# Patient Record
Sex: Female | Born: 1970 | Race: White | Hispanic: No | Marital: Married | Smoking: Never smoker
Health system: Southern US, Community
[De-identification: ages and names within clinical notes are randomized; demographics above are authoritative.]

## PROBLEM LIST (undated history)

## (undated) DIAGNOSIS — Z87442 Personal history of urinary calculi: Secondary | ICD-10-CM

## (undated) DIAGNOSIS — R519 Headache, unspecified: Secondary | ICD-10-CM

## (undated) DIAGNOSIS — IMO0002 Reserved for concepts with insufficient information to code with codable children: Secondary | ICD-10-CM

## (undated) DIAGNOSIS — R112 Nausea with vomiting, unspecified: Secondary | ICD-10-CM

## (undated) DIAGNOSIS — K219 Gastro-esophageal reflux disease without esophagitis: Secondary | ICD-10-CM

## (undated) DIAGNOSIS — M329 Systemic lupus erythematosus, unspecified: Secondary | ICD-10-CM

## (undated) DIAGNOSIS — I499 Cardiac arrhythmia, unspecified: Secondary | ICD-10-CM

## (undated) DIAGNOSIS — I10 Essential (primary) hypertension: Secondary | ICD-10-CM

## (undated) DIAGNOSIS — F419 Anxiety disorder, unspecified: Secondary | ICD-10-CM

## (undated) DIAGNOSIS — Z9889 Other specified postprocedural states: Secondary | ICD-10-CM

## (undated) DIAGNOSIS — R011 Cardiac murmur, unspecified: Secondary | ICD-10-CM

## (undated) HISTORY — PX: OTHER SURGICAL HISTORY: SHX169

## (undated) HISTORY — PX: HERNIA REPAIR: SHX51

---

## 2005-11-30 ENCOUNTER — Observation Stay: Payer: Self-pay

## 2005-12-22 ENCOUNTER — Inpatient Hospital Stay: Payer: Self-pay

## 2005-12-22 ENCOUNTER — Other Ambulatory Visit: Payer: Self-pay

## 2008-07-01 ENCOUNTER — Ambulatory Visit: Payer: Self-pay | Admitting: Unknown Physician Specialty

## 2010-11-18 ENCOUNTER — Ambulatory Visit: Payer: Self-pay | Admitting: Surgery

## 2012-02-15 ENCOUNTER — Ambulatory Visit: Payer: Self-pay | Admitting: Obstetrics and Gynecology

## 2012-02-21 ENCOUNTER — Ambulatory Visit: Payer: Self-pay | Admitting: Obstetrics and Gynecology

## 2013-01-24 ENCOUNTER — Other Ambulatory Visit: Payer: Self-pay | Admitting: Rheumatology

## 2013-01-29 LAB — CULTURE, BLOOD (SINGLE)

## 2013-07-03 ENCOUNTER — Ambulatory Visit: Payer: Self-pay | Admitting: Obstetrics and Gynecology

## 2013-08-17 IMAGING — MG MM ADDITIONAL VIEWS AT NO CHARGE
1 series · 2 of 2 positions shown · non-contrast
Comparison: none

REASON FOR EXAM: av lt spiculated asymmetry
COMMENTS:

PROCEDURE:     MAM - MAM DGTL ADD VW LT  SCR  - February 21, 2012  [DATE]
RESULT:     TECHNIQUE: Digital diagnostic left mammograms were obtained. FDA
approved computer-aided detection (CAD) for mammography was utilized for
this study.

[L MLO · left · 2 of 2 slices shown]
[im 1/2]
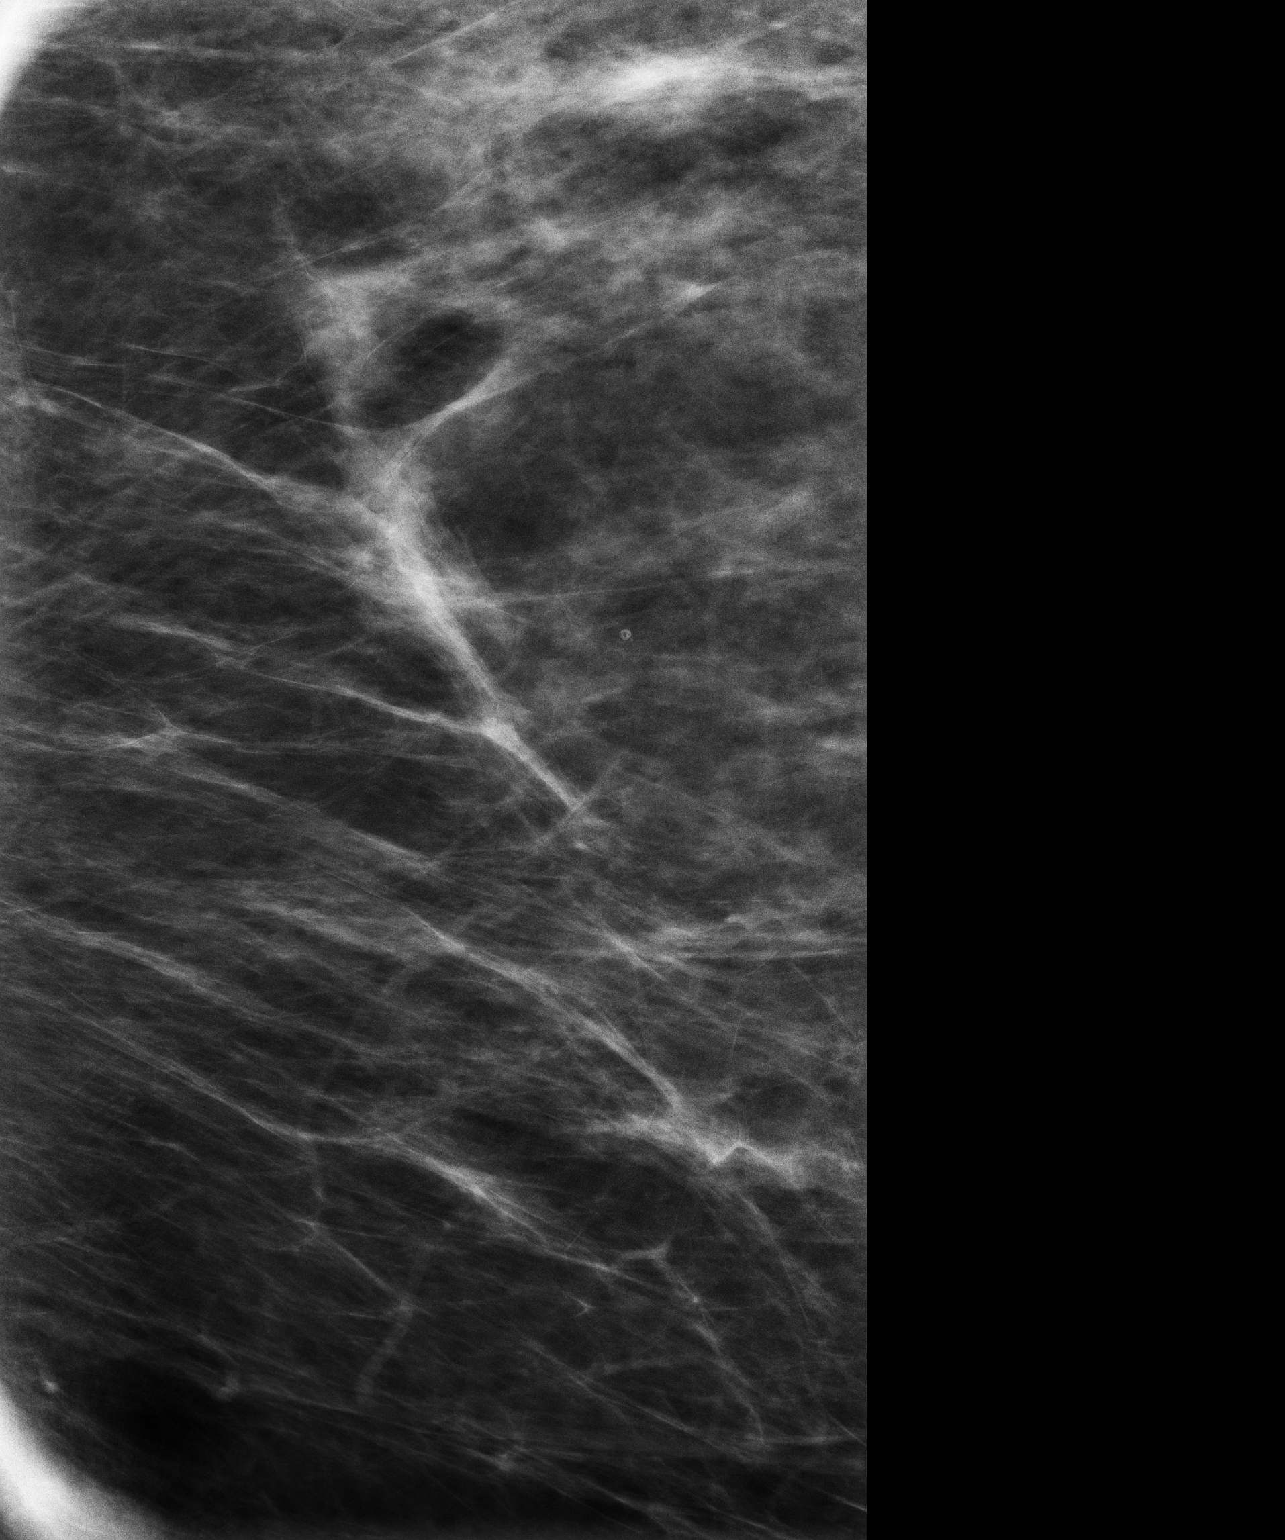
[im 2/2]
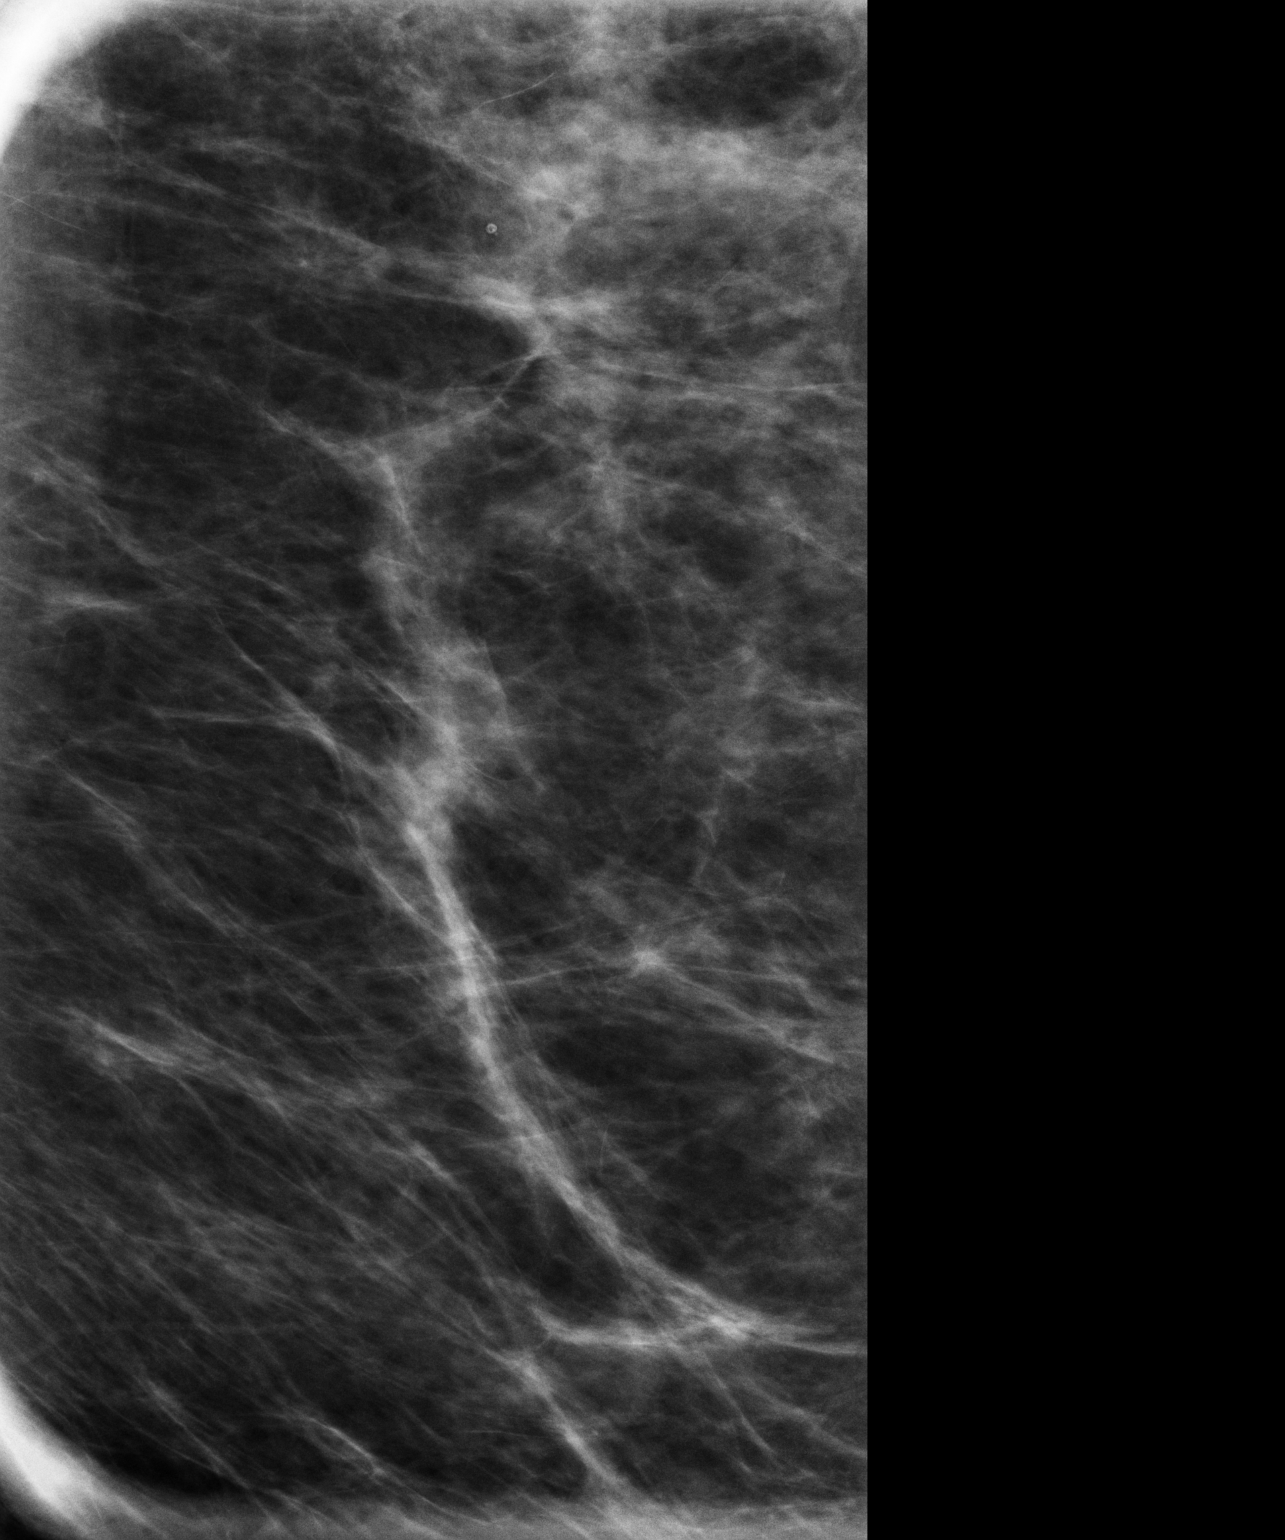

[2 of 2 positions shown; findings below may reference images not displayed]

FINDING: True lateral view and spot compression views of the left breast were
performed. The area of concern in the central left breast breast compresses
and blends in with the adjacent fibroglandular tissue without a persistent
mass or architectural distortion. There are no suspicious
microcalcifications.
IMPRESSION: 1.     Negative diagnostic left breast mammogram.
2.     Return to annual mammographic follow up recommended.
3.     BI-RADS:  Category 2- Benign.

A negative mammogram report does not preclude biopsy or other evaluation of
a clinically palpable or otherwise suspicious mass or lesion. Breast cancer
may not be detected by mammography in up to 10% of cases.

[REDACTED]

## 2013-11-05 DIAGNOSIS — Z8679 Personal history of other diseases of the circulatory system: Secondary | ICD-10-CM | POA: Insufficient documentation

## 2013-11-05 DIAGNOSIS — G43719 Chronic migraine without aura, intractable, without status migrainosus: Secondary | ICD-10-CM | POA: Insufficient documentation

## 2013-11-05 DIAGNOSIS — M359 Systemic involvement of connective tissue, unspecified: Secondary | ICD-10-CM | POA: Insufficient documentation

## 2014-09-15 ENCOUNTER — Other Ambulatory Visit: Payer: Self-pay | Admitting: Obstetrics and Gynecology

## 2014-09-15 DIAGNOSIS — Z1231 Encounter for screening mammogram for malignant neoplasm of breast: Secondary | ICD-10-CM

## 2014-09-24 ENCOUNTER — Ambulatory Visit
Admission: RE | Admit: 2014-09-24 | Discharge: 2014-09-24 | Disposition: A | Discharge status: Home / Self Care | Payer: BLUE CROSS/BLUE SHIELD | Source: Ambulatory Visit | Attending: Obstetrics and Gynecology | Admitting: Obstetrics and Gynecology

## 2014-09-24 DIAGNOSIS — Z1231 Encounter for screening mammogram for malignant neoplasm of breast: Secondary | ICD-10-CM

## 2016-01-27 ENCOUNTER — Other Ambulatory Visit: Payer: Self-pay | Admitting: Obstetrics and Gynecology

## 2016-03-14 ENCOUNTER — Other Ambulatory Visit: Payer: Self-pay | Admitting: Obstetrics and Gynecology

## 2016-03-14 DIAGNOSIS — Z1231 Encounter for screening mammogram for malignant neoplasm of breast: Secondary | ICD-10-CM

## 2016-04-25 ENCOUNTER — Ambulatory Visit: Payer: BLUE CROSS/BLUE SHIELD

## 2016-05-18 ENCOUNTER — Ambulatory Visit
Admission: RE | Admit: 2016-05-18 | Discharge: 2016-05-18 | Disposition: A | Discharge status: Home / Self Care | Payer: BLUE CROSS/BLUE SHIELD | Source: Ambulatory Visit | Attending: Obstetrics and Gynecology | Admitting: Obstetrics and Gynecology

## 2016-05-18 DIAGNOSIS — Z1231 Encounter for screening mammogram for malignant neoplasm of breast: Secondary | ICD-10-CM | POA: Insufficient documentation

## 2017-04-12 ENCOUNTER — Other Ambulatory Visit: Payer: Self-pay | Admitting: Obstetrics and Gynecology

## 2017-04-12 DIAGNOSIS — Z1231 Encounter for screening mammogram for malignant neoplasm of breast: Secondary | ICD-10-CM

## 2017-05-22 ENCOUNTER — Ambulatory Visit
Admission: RE | Admit: 2017-05-22 | Discharge: 2017-05-22 | Disposition: A | Discharge status: Home / Self Care | Payer: BLUE CROSS/BLUE SHIELD | Source: Ambulatory Visit | Attending: Obstetrics and Gynecology | Admitting: Obstetrics and Gynecology

## 2017-05-22 DIAGNOSIS — Z1231 Encounter for screening mammogram for malignant neoplasm of breast: Secondary | ICD-10-CM | POA: Diagnosis present

## 2019-02-06 ENCOUNTER — Other Ambulatory Visit: Payer: Self-pay | Admitting: Obstetrics and Gynecology

## 2019-02-06 DIAGNOSIS — Z1231 Encounter for screening mammogram for malignant neoplasm of breast: Secondary | ICD-10-CM

## 2019-05-01 ENCOUNTER — Ambulatory Visit
Admission: RE | Admit: 2019-05-01 | Discharge: 2019-05-01 | Disposition: A | Discharge status: Home / Self Care | Payer: BC Managed Care – PPO | Source: Ambulatory Visit | Attending: Obstetrics and Gynecology | Admitting: Obstetrics and Gynecology

## 2019-05-01 DIAGNOSIS — Z1231 Encounter for screening mammogram for malignant neoplasm of breast: Secondary | ICD-10-CM | POA: Insufficient documentation

## 2020-02-10 ENCOUNTER — Other Ambulatory Visit: Payer: Self-pay | Admitting: Obstetrics and Gynecology

## 2020-02-10 DIAGNOSIS — Z1231 Encounter for screening mammogram for malignant neoplasm of breast: Secondary | ICD-10-CM

## 2020-05-20 ENCOUNTER — Other Ambulatory Visit: Payer: Self-pay

## 2020-05-20 ENCOUNTER — Ambulatory Visit
Admission: RE | Admit: 2020-05-20 | Discharge: 2020-05-20 | Disposition: A | Discharge status: Home / Self Care | Payer: BC Managed Care – PPO | Source: Ambulatory Visit | Attending: Obstetrics and Gynecology | Admitting: Obstetrics and Gynecology

## 2020-05-20 DIAGNOSIS — Z1231 Encounter for screening mammogram for malignant neoplasm of breast: Secondary | ICD-10-CM | POA: Insufficient documentation

## 2020-12-31 ENCOUNTER — Other Ambulatory Visit: Payer: Self-pay | Admitting: Obstetrics and Gynecology

## 2020-12-31 DIAGNOSIS — N6459 Other signs and symptoms in breast: Secondary | ICD-10-CM

## 2021-01-01 ENCOUNTER — Other Ambulatory Visit: Payer: Self-pay | Admitting: Obstetrics and Gynecology

## 2021-01-01 DIAGNOSIS — N6459 Other signs and symptoms in breast: Secondary | ICD-10-CM

## 2021-01-01 DIAGNOSIS — N62 Hypertrophy of breast: Secondary | ICD-10-CM

## 2021-01-06 ENCOUNTER — Other Ambulatory Visit: Payer: Self-pay

## 2021-01-06 ENCOUNTER — Ambulatory Visit
Admission: RE | Admit: 2021-01-06 | Discharge: 2021-01-06 | Disposition: A | Discharge status: Home / Self Care | Payer: BC Managed Care – PPO | Source: Ambulatory Visit | Attending: Obstetrics and Gynecology | Admitting: Obstetrics and Gynecology

## 2021-01-06 DIAGNOSIS — N62 Hypertrophy of breast: Secondary | ICD-10-CM | POA: Diagnosis present

## 2021-01-06 DIAGNOSIS — N6459 Other signs and symptoms in breast: Secondary | ICD-10-CM

## 2021-01-12 ENCOUNTER — Other Ambulatory Visit: Payer: Self-pay | Admitting: Obstetrics and Gynecology

## 2021-01-12 DIAGNOSIS — N6320 Unspecified lump in the left breast, unspecified quadrant: Secondary | ICD-10-CM

## 2021-01-12 DIAGNOSIS — R928 Other abnormal and inconclusive findings on diagnostic imaging of breast: Secondary | ICD-10-CM

## 2021-01-20 ENCOUNTER — Ambulatory Visit
Admission: RE | Admit: 2021-01-20 | Discharge: 2021-01-20 | Disposition: A | Discharge status: Home / Self Care | Payer: BC Managed Care – PPO | Source: Ambulatory Visit | Attending: Obstetrics and Gynecology | Admitting: Obstetrics and Gynecology

## 2021-01-20 ENCOUNTER — Other Ambulatory Visit: Payer: Self-pay

## 2021-01-20 DIAGNOSIS — N6320 Unspecified lump in the left breast, unspecified quadrant: Secondary | ICD-10-CM

## 2021-01-20 DIAGNOSIS — R928 Other abnormal and inconclusive findings on diagnostic imaging of breast: Secondary | ICD-10-CM

## 2021-01-20 HISTORY — PX: BREAST BIOPSY: SHX20

## 2021-01-21 LAB — SURGICAL PATHOLOGY

## 2021-07-12 ENCOUNTER — Other Ambulatory Visit: Payer: Self-pay | Admitting: Obstetrics and Gynecology

## 2021-07-12 DIAGNOSIS — N62 Hypertrophy of breast: Secondary | ICD-10-CM

## 2021-07-12 DIAGNOSIS — R928 Other abnormal and inconclusive findings on diagnostic imaging of breast: Secondary | ICD-10-CM

## 2021-07-12 DIAGNOSIS — N6459 Other signs and symptoms in breast: Secondary | ICD-10-CM

## 2021-07-28 ENCOUNTER — Ambulatory Visit: Payer: BC Managed Care – PPO | Admitting: Adult Health

## 2021-08-04 ENCOUNTER — Ambulatory Visit
Admission: RE | Admit: 2021-08-04 | Discharge: 2021-08-04 | Disposition: A | Discharge status: Home / Self Care | Payer: BC Managed Care – PPO | Source: Ambulatory Visit | Attending: Obstetrics and Gynecology | Admitting: Obstetrics and Gynecology

## 2021-08-04 DIAGNOSIS — N62 Hypertrophy of breast: Secondary | ICD-10-CM | POA: Insufficient documentation

## 2021-08-04 DIAGNOSIS — N6459 Other signs and symptoms in breast: Secondary | ICD-10-CM

## 2021-08-04 DIAGNOSIS — R928 Other abnormal and inconclusive findings on diagnostic imaging of breast: Secondary | ICD-10-CM | POA: Insufficient documentation

## 2021-11-12 ENCOUNTER — Ambulatory Visit (INDEPENDENT_AMBULATORY_CARE_PROVIDER_SITE_OTHER): Payer: BC Managed Care – PPO | Admitting: Plastic Surgery

## 2021-11-12 ENCOUNTER — Encounter: Payer: Self-pay | Admitting: Plastic Surgery

## 2021-11-12 DIAGNOSIS — M546 Pain in thoracic spine: Secondary | ICD-10-CM

## 2021-11-12 DIAGNOSIS — N62 Hypertrophy of breast: Secondary | ICD-10-CM | POA: Insufficient documentation

## 2021-11-12 DIAGNOSIS — M545 Low back pain, unspecified: Secondary | ICD-10-CM | POA: Diagnosis not present

## 2021-11-12 DIAGNOSIS — G8929 Other chronic pain: Secondary | ICD-10-CM

## 2021-11-12 DIAGNOSIS — M549 Dorsalgia, unspecified: Secondary | ICD-10-CM | POA: Insufficient documentation

## 2021-11-12 DIAGNOSIS — M542 Cervicalgia: Secondary | ICD-10-CM | POA: Diagnosis not present

## 2021-11-12 DIAGNOSIS — R21 Rash and other nonspecific skin eruption: Secondary | ICD-10-CM

## 2021-11-12 DIAGNOSIS — Z6841 Body Mass Index (BMI) 40.0 and over, adult: Secondary | ICD-10-CM

## 2021-11-12 NOTE — Progress Notes (Addendum)
Patient ID: Margaret Anthony, female    DOB: 1971-01-22, 51 y.o.   MRN: [id number]   Chief Complaint  Patient presents with   Consult        Breast Problem    Mammary Hyperplasia: The patient is a 51 y.o. female with a history of mammary hyperplasia for several years.  She has extremely large breasts causing symptoms that include the following: Back pain in the upper and lower back, including neck pain. She pulls or pins her bra straps to provide better lift and relief of the pressure and pain. She notices relief by holding her breast up manually.  Her shoulder straps cause grooves and pain and pressure that requires padding for relief. Pain medication is sometimes required with motrin and tylenol.  Activities that are hindered by enlarged breasts include: exercise and running.  She has tried supportive clothing as well as fitted bras without improvement.  Her breasts are extremely large and fairly symmetric but the left is longer than the right.  She has hyperpigmentation of the inframammary area on both sides.  The sternal to nipple distance on the right is 35 cm and the left is 36.2 cm.  The IMF distance is 18 cm.  She is 5 feet 2 inches tall and weighs 198 pounds.  The BMI = 43.7 kg/m.  Preoperative bra size = DDD/E cup.  The estimated excess breast tissue to be removed at the time of surgery = 700-730 grams on the left and 700-730 grams on the right.  Mammogram history: 4/23 negative.  Family history of breast cancer:  no.  Tobacco use:  no.   The patient expresses the desire to pursue surgical intervention.     Review of Systems  Constitutional: Negative.   Eyes: Negative.   Respiratory: Negative.  Negative for chest tightness and shortness of breath.   Cardiovascular: Negative.   Gastrointestinal: Negative.   Endocrine: Negative.   Genitourinary: Negative.   Musculoskeletal:  Positive for back pain and neck pain.  Skin:  Positive for rash.  Hematological: Negative.      History reviewed. No pertinent past medical history.  Past Surgical History:  Procedure Laterality Date   BREAST BIOPSY Left 01/20/2021   [place] bx/ coil clip/Benign Mammary Parenchyma with Mild Stromal Fibrosis/focal PASH      Current Outpatient Medications:    aspirin EC 81 MG tablet, Take 1 tablet by mouth daily., Disp: , Rfl:    Calcium Carb-Cholecalciferol 600-10 MG-MCG TABS, Take 1 capsule by mouth 2 (two) times daily., Disp: , Rfl:    Cholecalciferol 25 MCG (1000 UT) capsule, Take 1 capsule by mouth daily., Disp: , Rfl:    diltiazem (CARDIZEM) 60 MG tablet, Take 60 mg by mouth 2 (two) times daily., Disp: , Rfl:    Erenumab-aooe (AIMOVIG) 70 MG/ML SOAJ, INJECT 70 MG SUBCUTANEOUSLY EVERY 28 (TWENTY-EIGHT) DAYS, Disp: , Rfl:    hydroxychloroquine (PLAQUENIL) 200 MG tablet, Take 200 mg by mouth every other day., Disp: , Rfl:    magnesium oxide (MAG-OX) 400 (240 Mg) MG tablet, Take 1 tablet by mouth daily., Disp: , Rfl:    nystatin (MYCOSTATIN/NYSTOP) powder, Apply topically 2 (two) times daily., Disp: , Rfl:    omeprazole (PRILOSEC) 20 MG capsule, Take 20 mg by mouth daily., Disp: , Rfl:    propranolol (INDERAL) 60 MG tablet, Take 60 mg by mouth 2 (two) times daily., Disp: , Rfl:    SUMAtriptan (IMITREX) 100 MG tablet, TAKE 1 TABLET ONCE  AS NEEDED FOR MIGRAINE FOR UP TO 1 DOSE MAY TAKE 2ND DOSE AFTER 2 HRS IF NEEDED, Disp: , Rfl:    triamcinolone cream (KENALOG) 0.1 %, Apply topically 2 (two) times daily as needed., Disp: , Rfl:    UBRELVY 100 MG TABS, Take 1 tablet by mouth daily as needed., Disp: , Rfl:    Objective:   Vitals:   11/12/21 1110  BP: (!) 143/92  Pulse: 77  SpO2: 98%    Physical Exam Vitals and nursing note reviewed.  Constitutional:      Appearance: Normal appearance.  HENT:     Head: Normocephalic and atraumatic.  Cardiovascular:     Rate and Rhythm: Normal rate.     Pulses: Normal pulses.  Pulmonary:     Effort: Pulmonary effort is normal.  Abdominal:      General: There is no distension.     Palpations: Abdomen is soft.     Hernia: No hernia is present.  Musculoskeletal:        General: No swelling or deformity.  Skin:    General: Skin is warm.     Capillary Refill: Capillary refill takes less than 2 seconds.     Coloration: Skin is not jaundiced.     Findings: No bruising.  Neurological:     Mental Status: She is alert and oriented to person, place, and time.  Psychiatric:        Mood and Affect: Mood normal.        Behavior: Behavior normal.        Thought Content: Thought content normal.        Judgment: Judgment normal.     Assessment & Plan:  Neck pain  Chronic bilateral thoracic back pain  Symptomatic mammary hypertrophy  The procedure the patient selected and that was best for the patient was discussed. The risk were discussed and include but not limited to the following:  Breast asymmetry, fluid accumulation, firmness of the breast, inability to breast feed, loss of nipple or areola, skin loss, change in skin and nipple sensation, fat necrosis of the breast tissue, bleeding, infection and healing delay.  There are risks of anesthesia and injury to nerves or blood vessels.  Allergic reaction to tape, suture and skin glue are possible.  There will be swelling.  Any of these can lead to the need for revisional surgery.  A breast reduction has potential to interfere with diagnostic procedures in the future.  This procedure is best done when the breast is fully developed.  Changes in the breast will continue to occur over time: pregnancy, weight gain or weigh loss.    Total time: 40 minutes. This includes time spent with the patient during the visit as well as time spent before and after the visit reviewing the chart, documenting the encounter, ordering pertinent studies and literature for the patient.   Physical therapy:  ordered Mammogram:  done  Patient is a good candidate for bilateral breast reduction with liposuction.   She will follow up in 3 months after she finishes PT.  Pictures were obtained of the patient and placed in the chart with the patient's or guardian's permission.    [place], DO

## 2022-01-27 ENCOUNTER — Telehealth: Payer: Self-pay | Admitting: *Deleted

## 2022-01-27 NOTE — Telephone Encounter (Signed)
Received on (01/07/22) via of fax Discharge Report from [facility].  Requesting signature and fax back.  Given to provider to sign.    Discharge Report signed and faxed back to [facility].  Confirmation received and copy scanned into the chart.//AB/CMA

## 2022-02-11 ENCOUNTER — Ambulatory Visit: Payer: BC Managed Care – PPO | Admitting: Physician Assistant

## 2022-03-22 ENCOUNTER — Other Ambulatory Visit: Payer: Self-pay | Admitting: Internal Medicine

## 2022-03-22 DIAGNOSIS — R1032 Left lower quadrant pain: Secondary | ICD-10-CM

## 2022-03-31 ENCOUNTER — Ambulatory Visit
Admission: RE | Admit: 2022-03-31 | Discharge: 2022-03-31 | Disposition: A | Discharge status: Home / Self Care | Payer: BC Managed Care – PPO | Source: Ambulatory Visit | Attending: Internal Medicine | Admitting: Internal Medicine

## 2022-03-31 DIAGNOSIS — R1032 Left lower quadrant pain: Secondary | ICD-10-CM

## 2022-03-31 MED ORDER — IOPAMIDOL (ISOVUE-300) INJECTION 61%
100.0000 mL | Freq: Once | INTRAVENOUS | Status: AC | PRN
  Administered 2022-03-31: 100 mL via INTRAVENOUS

## 2022-04-20 ENCOUNTER — Encounter: Payer: Self-pay | Admitting: Physician Assistant

## 2022-04-20 ENCOUNTER — Ambulatory Visit: Payer: No Typology Code available for payment source | Admitting: Physician Assistant

## 2022-04-20 ENCOUNTER — Other Ambulatory Visit: Payer: Self-pay | Admitting: Urology

## 2022-04-20 VITALS — BP 140/88 | HR 76 | Ht 63.5 in | Wt 171.2 lb

## 2022-04-20 DIAGNOSIS — M546 Pain in thoracic spine: Secondary | ICD-10-CM | POA: Diagnosis not present

## 2022-04-20 DIAGNOSIS — R21 Rash and other nonspecific skin eruption: Secondary | ICD-10-CM | POA: Diagnosis not present

## 2022-04-20 DIAGNOSIS — M542 Cervicalgia: Secondary | ICD-10-CM | POA: Diagnosis not present

## 2022-04-20 DIAGNOSIS — N62 Hypertrophy of breast: Secondary | ICD-10-CM

## 2022-04-20 DIAGNOSIS — Z6829 Body mass index (BMI) 29.0-29.9, adult: Secondary | ICD-10-CM

## 2022-04-20 NOTE — Progress Notes (Signed)
   Referring Provider McVey, Murray Hodgkins, CNM [place],  [place] [address]   CC:  Chief Complaint  Patient presents with   Follow-up      Margaret Anthony is an 52 y.o. female.  HPI: Patient is a 52 y.o. year old female here for follow up after completing physical therapy for pain related to macromastia.  She was seen for initial consult by Dr. Marla Roe.  At that time, she complained of chronic upper back and neck discomfort in the context of large breasts.  BMI equals 43.7 kg/m.  Preoperative bra size equals DDD/E cup.  Estimated excess breast tissue at time of surgery equals 650 to 700 g each side.  STN 35 cm on the right, 36 cm on the left.  Mammogram 07/2021 negative.  Deemed a good candidate for bilateral breast reduction with liposuction.  Referred to PT.  Today, she tells that she completed her 6 sessions of PT.  She states that there is some temporary relief, but that her symptoms quickly returned and that she would still very much like to proceed with breast reduction surgery.  She has to apply nystatin powder daily for her frequent inframammary skin breakdown and reports that she has activities hindered by her large breast size.  She also feels as though she has difficulty with clothes fitting.  She is looking forward to the functional improvements from her breast reduction.  Patient tells me that she also was recently diagnosed with renal cell carcinoma and is planning for right-sided partial nephrectomy 05/2022.  While she is still hopeful to proceed with a breast reduction surgery, it will obviously have to wait until she is recovered from her [name]/partial nephrectomy.  Review of Systems General: Denies fevers MSK: Endorses ongoing back and neck discomfort Skin: Endorses frequent inframammary rashes  Physical Exam    04/20/2022   11:06 AM 11/12/2021   11:10 AM  Vitals with BMI  Height 5' 3.5" 5' 4"$   Weight 171 lbs 3 oz 199 lbs  BMI [id number]   Systolic [id number]  Diastolic 88 92  Pulse 76 77    General:  No acute distress,  Alert and oriented, Non-Toxic, Normal speech and affect Psych: Normal behavior and mood Respiratory: No increased WOB MSK: Ambulatory  Assessment/Plan  Patient is interested in pursuing surgical intervention for bilateral breast reduction. Patient has completed at least 6 weeks of physical therapy for pain related to macromastia.  Plan for surgery to be either later this summer or even in the fall.  She will need to call the office after she has completed her surgical treatment for RCC to discuss scheduling.  Discussed with patient we would submit to insurance for authorization, discussed approval could take up to 6 weeks.   Krista Blue 04/20/2022, 12:06 PM

## 2022-05-11 NOTE — Progress Notes (Addendum)
Anesthesia Review:  PCP: DR  Georgie Chard  Lupus MD- DR Jefm Bryant but has retired has new MD  Cardiologist : none  Neurology- DR Manuella Ghazi  Chest x-ray : EKG : 05/16/22  Echo : Stress test: Cardiac Cath :  Activity level: can do a flight of stairs without difficulty  Sleep Study/ CPAP : none  Fasting Blood Sugar :      / Checks Blood Sugar -- times a day:   Blood Thinner/ Instructions /Last Dose: ASA / Instructions/ Last Dose :    81 mg aspirin  Hx of Lupus per pt PT states she has the PTT anticoagulant antibody but has never had any issues.   Husband with pt at preop appt.   PT states at preop appt  that when she gets stressed she will get a migraine headache and asked if she could take am of surgery if needed Ubrelvy and/or Imitrex.  PT was instructed she could .   PT has copy of clear liquid diet at home from [facility] and bowel prep instructions from them also.     Type and Screen pos for antibodies.  Needs Type and Screen DOS.

## 2022-05-13 NOTE — Patient Instructions (Signed)
SURGICAL WAITING ROOM VISITATION  Patients having surgery or a procedure may have no more than 2 support people in the waiting area - these visitors may rotate.    Children under the age of 71 must have an adult with them who is not the patient.  Due to an increase in RSV and influenza rates and associated hospitalizations, children ages 63 and under may not visit patients in [place].  If the patient needs to stay at the hospital during part of their recovery, the visitor guidelines for inpatient rooms apply. Pre-op nurse will coordinate an appropriate time for 1 support person to accompany patient in pre-op.  This support person may not rotate.    Please refer to the [facility] website for the visitor guidelines for Inpatients (after your surgery is over and you are in a regular room).       Your procedure is scheduled on:  05/25/22    Report to [facility] Main [facility]trance    Report to admitting at   1000AM   Call this number if you have problems the morning of surgery [phone]   Do not eat food : or drink liquids aftger  Midnight.          Clear liquid diet the day before surgery.  Magnesium Citrate one bottle at 12 noon day before surgery.                 If you have questions, please contact your surgeon's office.   FOLLOW BOWEL PREP AND ANY ADDITIONAL PRE OP INSTRUCTIONS YOU RECEIVED FROM YOUR SURGEON'S OFFICE!!!     Oral Hygiene is also important to reduce your risk of infection.                                    Remember - BRUSH YOUR TEETH THE MORNING OF SURGERY WITH YOUR REGULAR TOOTHPASTE  DENTURES WILL BE REMOVED PRIOR TO SURGERY PLEASE DO NOT APPLY "Poly grip" OR ADHESIVES!!!   Do NOT smoke after Midnight   Take these medicines the morning of surgery with A SIP OF WATER:  cardizem, omeprazole, propanolol   DO NOT TAKE ANY ORAL DIABETIC MEDICATIONS DAY OF YOUR SURGERY  Bring CPAP mask and tubing day of surgery.                               You may not have any metal on your body including hair pins, jewelry, and body piercing             Do not wear make-up, lotions, powders, perfumes/cologne, or deodorant  Do not wear nail polish including gel and S&S, artificial/acrylic nails, or any other type of covering on natural nails including finger and toenails. If you have artificial nails, gel coating, etc. that needs to be removed by a nail salon please have this removed prior to surgery or surgery may need to be canceled/ delayed if the surgeon/ anesthesia feels like they are unable to be safely monitored.   Do not shave  48 hours prior to surgery.               Men may shave face and neck.   Do not bring valuables to the hospital. [place].  Contacts, glasses, dentures or bridgework may not be worn into surgery.   Bring small overnight bag day of surgery.   DO NOT [place]. PHARMACY WILL DISPENSE MEDICATIONS LISTED ON YOUR MEDICATION LIST TO YOU DURING YOUR ADMISSION [place]!    Patients discharged on the day of surgery will not be allowed to drive home.  Someone NEEDS to stay with you for the first 24 hours after anesthesia.   Special Instructions: Bring a copy of your healthcare power of attorney and living will documents the day of surgery if you haven't scanned them before.              Please read over the following fact sheets you were given: IF [place] [phone]   If you received a COVID test during your pre-op visit  it is requested that you wear a mask when out in public, stay away from anyone that may not be feeling well and notify your surgeon if you develop symptoms. If you test positive for Covid or have been in contact with anyone that has tested positive in the last 10 days please notify you surgeon.    [facility] - Preparing for Surgery Before surgery,  you can play an important role.  Because skin is not sterile, your skin needs to be as free of germs as possible.  You can reduce the number of germs on your skin by washing with CHG (chlorahexidine gluconate) soap before surgery.  CHG is an antiseptic cleaner which kills germs and bonds with the skin to continue killing germs even after washing. Please DO NOT use if you have an allergy to CHG or antibacterial soaps.  If your skin becomes reddened/irritated stop using the CHG and inform your nurse when you arrive at Short Stay. Do not shave (including legs and underarms) for at least 48 hours prior to the first CHG shower.  You may shave your face/neck. Please follow these instructions carefully:  1.  Shower with CHG Soap the night before surgery and the  morning of Surgery.  2.  If you choose to wash your hair, wash your hair first as usual with your  normal  shampoo.  3.  After you shampoo, rinse your hair and body thoroughly to remove the  shampoo.                           4.  Use CHG as you would any other liquid soap.  You can apply chg directly  to the skin and wash                       Gently with a scrungie or clean washcloth.  5.  Apply the CHG Soap to your body ONLY FROM THE NECK DOWN.   Do not use on face/ open                           Wound or open sores. Avoid contact with eyes, ears mouth and genitals (private parts).                       Wash face,  Genitals (private parts) with your normal soap.             6.  Wash thoroughly, paying special attention to the area where  your surgery  will be performed.  7.  Thoroughly rinse your body with warm water from the neck down.  8.  DO NOT shower/wash with your normal soap after using and rinsing off  the CHG Soap.                9.  Pat yourself dry with a clean towel.            10.  Wear clean pajamas.            11.  Place clean sheets on your bed the night of your first shower and do not  sleep with pets. Day of Surgery : Do not  apply any lotions/deodorants the morning of surgery.  Please wear clean clothes to the hospital/surgery center.  FAILURE TO FOLLOW THESE INSTRUCTIONS MAY RESULT IN THE CANCELLATION OF YOUR SURGERY PATIENT SIGNATURE_________________________________  NURSE SIGNATURE__________________________________  ________________________________________________________________________[facility] - Preparing for Surgery Before surgery, you can play an important role.  Because skin is not sterile, your skin needs to be as free of germs as possible.  You can reduce the number of germs on your skin by washing with CHG (chlorahexidine gluconate) soap before surgery.  CHG is an antiseptic cleaner which kills germs and bonds with the skin to continue killing germs even after washing. Please DO NOT use if you have an allergy to CHG or antibacterial soaps.  If your skin becomes reddened/irritated stop using the CHG and inform your nurse when you arrive at Short Stay. Do not shave (including legs and underarms) for at least 48 hours prior to the first CHG shower.  You may shave your face/neck. Please follow these instructions carefully:  1.  Shower with CHG Soap the night before surgery and the  morning of Surgery.  2.  If you choose to wash your hair, wash your hair first as usual with your  normal  shampoo.  3.  After you shampoo, rinse your hair and body thoroughly to remove the  shampoo.                           4.  Use CHG as you would any other liquid soap.  You can apply chg directly  to the skin and wash                       Gently with a scrungie or clean washcloth.  5.  Apply the CHG Soap to your body ONLY FROM THE NECK DOWN.   Do not use on face/ open                           Wound or open sores. Avoid contact with eyes, ears mouth and genitals (private parts).                       Wash face,  Genitals (private parts) with your normal soap.             6.  Wash thoroughly, paying special attention to the  area where your surgery  will be performed.  7.  Thoroughly rinse your body with warm water from the neck down.  8.  DO NOT shower/wash with your normal soap after using and rinsing off  the CHG Soap.                9.  Pat yourself  dry with a clean towel.            10.  Wear clean pajamas.            11.  Place clean sheets on your bed the night of your first shower and do not  sleep with pets. Day of Surgery : Do not apply any lotions/deodorants the morning of surgery.  Please wear clean clothes to the hospital/surgery center.  FAILURE TO FOLLOW THESE INSTRUCTIONS MAY RESULT IN THE CANCELLATION OF YOUR SURGERY PATIENT SIGNATURE_________________________________  NURSE SIGNATURE__________________________________  ________________________________________________________________________

## 2022-05-16 ENCOUNTER — Encounter (HOSPITAL_COMMUNITY)
Admission: RE | Admit: 2022-05-16 | Discharge: 2022-05-16 | Disposition: A | Discharge status: Home / Self Care | Payer: No Typology Code available for payment source | Source: Ambulatory Visit | Attending: Urology | Admitting: Urology

## 2022-05-16 ENCOUNTER — Other Ambulatory Visit: Payer: Self-pay

## 2022-05-16 ENCOUNTER — Encounter (HOSPITAL_COMMUNITY): Payer: Self-pay

## 2022-05-16 VITALS — BP 136/87 | HR 71 | Temp 99.0°F | Resp 16 | Ht 63.5 in | Wt 164.0 lb

## 2022-05-16 DIAGNOSIS — Z01818 Encounter for other preprocedural examination: Secondary | ICD-10-CM | POA: Insufficient documentation

## 2022-05-16 DIAGNOSIS — R9431 Abnormal electrocardiogram [ECG] [EKG]: Secondary | ICD-10-CM | POA: Diagnosis not present

## 2022-05-16 HISTORY — DX: Other specified postprocedural states: Z98.890

## 2022-05-16 HISTORY — DX: Cardiac murmur, unspecified: R01.1

## 2022-05-16 HISTORY — DX: Systemic lupus erythematosus, unspecified: M32.9

## 2022-05-16 HISTORY — DX: Headache, unspecified: R51.9

## 2022-05-16 HISTORY — DX: Anxiety disorder, unspecified: F41.9

## 2022-05-16 HISTORY — DX: Personal history of urinary calculi: Z87.442

## 2022-05-16 HISTORY — DX: Other specified postprocedural states: R11.2

## 2022-05-16 HISTORY — DX: Essential (primary) hypertension: I10

## 2022-05-16 HISTORY — DX: Reserved for concepts with insufficient information to code with codable children: IMO0002

## 2022-05-16 HISTORY — DX: Cardiac arrhythmia, unspecified: I49.9

## 2022-05-16 HISTORY — DX: Gastro-esophageal reflux disease without esophagitis: K21.9

## 2022-05-16 LAB — CBC
HCT: 42.7 % (ref 36.0–46.0)
Hemoglobin: 14 g/dL (ref 12.0–15.0)
MCH: 28.9 pg (ref 26.0–34.0)
MCHC: 32.8 g/dL (ref 30.0–36.0)
MCV: 88 fL (ref 80.0–100.0)
Platelets: 322 10*3/uL (ref 150–400)
RBC: 4.85 MIL/uL (ref 3.87–5.11)
RDW: 12.8 % (ref 11.5–15.5)
WBC: 6.4 10*3/uL (ref 4.0–10.5)
nRBC: 0 % (ref 0.0–0.2)

## 2022-05-16 LAB — BASIC METABOLIC PANEL
Anion gap: 7 (ref 5–15)
BUN: 18 mg/dL (ref 6–20)
CO2: 24 mmol/L (ref 22–32)
Calcium: 9.7 mg/dL (ref 8.9–10.3)
Chloride: 108 mmol/L (ref 98–111)
Creatinine, Ser: 0.67 mg/dL (ref 0.44–1.00)
GFR, Estimated: >60 mL/min (ref >60)
Glucose, Bld: 104 mg/dL — ABNORMAL HIGH (ref 70–99)
Potassium: 4.9 mmol/L (ref 3.5–5.1)
Sodium: 139 mmol/L (ref 135–145)

## 2022-05-24 ENCOUNTER — Telehealth: Payer: Self-pay | Admitting: Plastic Surgery

## 2022-05-24 NOTE — Telephone Encounter (Signed)
Insurance auth started and all clinicals uploaded to [employer].  Pending [id number].

## 2022-05-25 ENCOUNTER — Ambulatory Visit (HOSPITAL_COMMUNITY): Payer: No Typology Code available for payment source | Admitting: Physician Assistant

## 2022-05-25 ENCOUNTER — Ambulatory Visit (HOSPITAL_BASED_OUTPATIENT_CLINIC_OR_DEPARTMENT_OTHER): Payer: No Typology Code available for payment source | Admitting: Anesthesiology

## 2022-05-25 ENCOUNTER — Other Ambulatory Visit: Payer: Self-pay

## 2022-05-25 ENCOUNTER — Encounter (HOSPITAL_COMMUNITY)
Admission: RE | Disposition: A | Discharge status: Home / Self Care | Payer: Self-pay | Source: Ambulatory Visit | Attending: Urology

## 2022-05-25 ENCOUNTER — Observation Stay (HOSPITAL_COMMUNITY)
Admission: RE | Admit: 2022-05-25 | Discharge: 2022-05-26 | Disposition: A | Discharge status: Home / Self Care | Payer: No Typology Code available for payment source | Source: Ambulatory Visit | Attending: Urology | Admitting: Urology

## 2022-05-25 ENCOUNTER — Encounter (HOSPITAL_COMMUNITY): Payer: Self-pay | Admitting: Urology

## 2022-05-25 DIAGNOSIS — Z79899 Other long term (current) drug therapy: Secondary | ICD-10-CM | POA: Insufficient documentation

## 2022-05-25 DIAGNOSIS — N2889 Other specified disorders of kidney and ureter: Secondary | ICD-10-CM | POA: Diagnosis not present

## 2022-05-25 DIAGNOSIS — C641 Malignant neoplasm of right kidney, except renal pelvis: Principal | ICD-10-CM | POA: Insufficient documentation

## 2022-05-25 DIAGNOSIS — I10 Essential (primary) hypertension: Secondary | ICD-10-CM | POA: Diagnosis not present

## 2022-05-25 DIAGNOSIS — Z01818 Encounter for other preprocedural examination: Secondary | ICD-10-CM

## 2022-05-25 DIAGNOSIS — G43019 Migraine without aura, intractable, without status migrainosus: Secondary | ICD-10-CM

## 2022-05-25 HISTORY — PX: ROBOTIC ASSITED PARTIAL NEPHRECTOMY: SHX6087

## 2022-05-25 LAB — BPAM RBC
Blood Product Expiration Date: 202403022359
Blood Product Expiration Date: 202403032359
Blood Product Expiration Date: 202403102359
Unit Type and Rh: 5100
Unit Type and Rh: 7300
Unit Type and Rh: 7300

## 2022-05-25 LAB — TYPE AND SCREEN
ABO/RH(D): B POS
Antibody Screen: POSITIVE
PT AG Type: NEGATIVE
Unit division: 0
Unit division: 0
Unit division: 0

## 2022-05-25 LAB — HEMOGLOBIN AND HEMATOCRIT, BLOOD
HCT: 42.1 % (ref 36.0–46.0)
Hemoglobin: 13.2 g/dL (ref 12.0–15.0)

## 2022-05-25 SURGERY — NEPHRECTOMY, PARTIAL, ROBOT-ASSISTED
Anesthesia: General | Site: Abdomen | Laterality: Right

## 2022-05-25 MED ORDER — PHENYLEPHRINE 80 MCG/ML (10ML) SYRINGE FOR IV PUSH (FOR BLOOD PRESSURE SUPPORT)
PREFILLED_SYRINGE | INTRAVENOUS | Status: DC | PRN
  Administered 2022-05-25 (×3): 160 ug via INTRAVENOUS

## 2022-05-25 MED ORDER — ONDANSETRON HCL 4 MG/2ML IJ SOLN
INTRAMUSCULAR | Status: AC
  Filled 2022-05-25: qty 2

## 2022-05-25 MED ORDER — DILTIAZEM HCL 60 MG PO TABS
60.0000 mg | ORAL_TABLET | Freq: Every day | ORAL | Status: DC
  Filled 2022-05-25: qty 1

## 2022-05-25 MED ORDER — FENTANYL CITRATE PF 50 MCG/ML IJ SOSY
PREFILLED_SYRINGE | INTRAMUSCULAR | Status: AC
  Filled 2022-05-25: qty 1

## 2022-05-25 MED ORDER — MAGNESIUM OXIDE -MG SUPPLEMENT 400 (240 MG) MG PO TABS
400.0000 mg | ORAL_TABLET | Freq: Every day | ORAL | Status: DC
  Filled 2022-05-25: qty 1

## 2022-05-25 MED ORDER — BUPIVACAINE LIPOSOME 1.3 % IJ SUSP
INTRAMUSCULAR | Status: AC
  Filled 2022-05-25: qty 20

## 2022-05-25 MED ORDER — STERILE WATER FOR IRRIGATION IR SOLN
Status: DC | PRN
  Administered 2022-05-25: 1000 mL

## 2022-05-25 MED ORDER — LACTATED RINGERS IR SOLN
Status: DC | PRN
  Administered 2022-05-25: 1000 mL

## 2022-05-25 MED ORDER — FENTANYL CITRATE PF 50 MCG/ML IJ SOSY
25.0000 ug | PREFILLED_SYRINGE | INTRAMUSCULAR | Status: DC | PRN
  Administered 2022-05-25 (×2): 50 ug via INTRAVENOUS

## 2022-05-25 MED ORDER — SUMATRIPTAN SUCCINATE 25 MG PO TABS
25.0000 mg | ORAL_TABLET | Freq: Once | ORAL | Status: AC
  Administered 2022-05-25: 25 mg via ORAL
  Filled 2022-05-25: qty 1

## 2022-05-25 MED ORDER — PANTOPRAZOLE SODIUM 40 MG PO TBEC
40.0000 mg | DELAYED_RELEASE_TABLET | Freq: Every day | ORAL | Status: DC
  Administered 2022-05-26: 40 mg via ORAL
  Filled 2022-05-25: qty 1

## 2022-05-25 MED ORDER — DOCUSATE SODIUM 100 MG PO CAPS
100.0000 mg | ORAL_CAPSULE | Freq: Two times a day (BID) | ORAL | Status: DC
  Administered 2022-05-25 – 2022-05-26 (×2): 100 mg via ORAL
  Filled 2022-05-25 (×2): qty 1

## 2022-05-25 MED ORDER — DIPHENHYDRAMINE HCL 12.5 MG/5ML PO ELIX: 12.5000 mg | ORAL_SOLUTION | Freq: Four times a day (QID) | ORAL | Status: DC | PRN

## 2022-05-25 MED ORDER — LACTATED RINGERS IV SOLN: INTRAVENOUS | Status: DC

## 2022-05-25 MED ORDER — DEXAMETHASONE SODIUM PHOSPHATE 10 MG/ML IJ SOLN
INTRAMUSCULAR | Status: AC
  Filled 2022-05-25: qty 1

## 2022-05-25 MED ORDER — AMISULPRIDE (ANTIEMETIC) 5 MG/2ML IV SOLN
INTRAVENOUS | Status: AC
  Filled 2022-05-25: qty 4

## 2022-05-25 MED ORDER — FENTANYL CITRATE (PF) 250 MCG/5ML IJ SOLN
INTRAMUSCULAR | Status: AC
  Filled 2022-05-25: qty 5

## 2022-05-25 MED ORDER — CHLORHEXIDINE GLUCONATE 0.12 % MT SOLN
15.0000 mL | Freq: Once | OROMUCOSAL | Status: AC
  Administered 2022-05-25: 15 mL via OROMUCOSAL

## 2022-05-25 MED ORDER — DEXAMETHASONE SODIUM PHOSPHATE 10 MG/ML IJ SOLN
INTRAMUSCULAR | Status: DC | PRN
  Administered 2022-05-25: 10 mg via INTRAVENOUS

## 2022-05-25 MED ORDER — PROPOFOL 10 MG/ML IV BOLUS
INTRAVENOUS | Status: DC | PRN
  Administered 2022-05-25: 130 mg via INTRAVENOUS

## 2022-05-25 MED ORDER — HYOSCYAMINE SULFATE 0.125 MG SL SUBL: 0.1250 mg | SUBLINGUAL_TABLET | SUBLINGUAL | Status: DC | PRN

## 2022-05-25 MED ORDER — CEFAZOLIN SODIUM-DEXTROSE 2-4 GM/100ML-% IV SOLN
2.0000 g | INTRAVENOUS | Status: AC
  Administered 2022-05-25: 2 g via INTRAVENOUS
  Filled 2022-05-25: qty 100

## 2022-05-25 MED ORDER — MINOXIDIL 2.5 MG PO TABS
2.5000 mg | ORAL_TABLET | Freq: Every day | ORAL | Status: DC
  Administered 2022-05-25: 2.5 mg via ORAL
  Filled 2022-05-25 (×2): qty 1

## 2022-05-25 MED ORDER — SODIUM CHLORIDE 0.45 % IV SOLN: INTRAVENOUS | Status: DC

## 2022-05-25 MED ORDER — TRAMADOL HCL 50 MG PO TABS: 50.0000 mg | ORAL_TABLET | Freq: Four times a day (QID) | ORAL | 0 refills | Status: DC | PRN

## 2022-05-25 MED ORDER — PROPRANOLOL HCL 20 MG PO TABS
60.0000 mg | ORAL_TABLET | Freq: Two times a day (BID) | ORAL | Status: DC
  Administered 2022-05-25: 60 mg via ORAL
  Filled 2022-05-25: qty 1
  Filled 2022-05-25 (×2): qty 3

## 2022-05-25 MED ORDER — MAGNESIUM CITRATE PO SOLN: 1.0000 | Freq: Once | ORAL | Status: DC

## 2022-05-25 MED ORDER — ORAL CARE MOUTH RINSE: 15.0000 mL | Freq: Once | OROMUCOSAL | Status: AC

## 2022-05-25 MED ORDER — SODIUM CHLORIDE (PF) 0.9 % IJ SOLN
INTRAMUSCULAR | Status: AC
  Filled 2022-05-25: qty 20

## 2022-05-25 MED ORDER — SODIUM CHLORIDE (PF) 0.9 % IJ SOLN
INTRAMUSCULAR | Status: DC | PRN
  Administered 2022-05-25: 20 mL

## 2022-05-25 MED ORDER — FENTANYL CITRATE (PF) 250 MCG/5ML IJ SOLN
INTRAMUSCULAR | Status: DC | PRN
  Administered 2022-05-25: 50 ug via INTRAVENOUS
  Administered 2022-05-25: 100 ug via INTRAVENOUS
  Administered 2022-05-25: 50 ug via INTRAVENOUS

## 2022-05-25 MED ORDER — DIPHENHYDRAMINE HCL 50 MG/ML IJ SOLN: 12.5000 mg | Freq: Four times a day (QID) | INTRAMUSCULAR | Status: DC | PRN

## 2022-05-25 MED ORDER — FENTANYL CITRATE PF 50 MCG/ML IJ SOSY
25.0000 ug | PREFILLED_SYRINGE | INTRAMUSCULAR | Status: DC | PRN
  Administered 2022-05-25 (×2): 25 ug via INTRAVENOUS
  Administered 2022-05-25 – 2022-05-26 (×5): 50 ug via INTRAVENOUS
  Filled 2022-05-25 (×8): qty 1

## 2022-05-25 MED ORDER — ACETAMINOPHEN 500 MG PO TABS
1000.0000 mg | ORAL_TABLET | Freq: Once | ORAL | Status: AC
  Administered 2022-05-25: 1000 mg via ORAL
  Filled 2022-05-25: qty 2

## 2022-05-25 MED ORDER — AMISULPRIDE (ANTIEMETIC) 5 MG/2ML IV SOLN
10.0000 mg | Freq: Once | INTRAVENOUS | Status: AC
  Administered 2022-05-25: 10 mg via INTRAVENOUS

## 2022-05-25 MED ORDER — SUGAMMADEX SODIUM 200 MG/2ML IV SOLN
INTRAVENOUS | Status: DC | PRN
  Administered 2022-05-25: 150 mg via INTRAVENOUS

## 2022-05-25 MED ORDER — DOCUSATE SODIUM 100 MG PO CAPS: 100.0000 mg | ORAL_CAPSULE | Freq: Two times a day (BID) | ORAL | Status: AC

## 2022-05-25 MED ORDER — PROPOFOL 10 MG/ML IV BOLUS
INTRAVENOUS | Status: AC
  Filled 2022-05-25: qty 20

## 2022-05-25 MED ORDER — ROCURONIUM BROMIDE 10 MG/ML (PF) SYRINGE
PREFILLED_SYRINGE | INTRAVENOUS | Status: DC | PRN
  Administered 2022-05-25: 60 mg via INTRAVENOUS
  Administered 2022-05-25: 15 mg via INTRAVENOUS

## 2022-05-25 MED ORDER — ONDANSETRON HCL 4 MG/2ML IJ SOLN
INTRAMUSCULAR | Status: DC | PRN
  Administered 2022-05-25 (×2): 4 mg via INTRAVENOUS

## 2022-05-25 MED ORDER — SCOPOLAMINE 1 MG/3DAYS TD PT72
1.0000 | MEDICATED_PATCH | Freq: Once | TRANSDERMAL | Status: DC
  Administered 2022-05-25: 1.5 mg via TRANSDERMAL

## 2022-05-25 MED ORDER — MIDAZOLAM HCL 2 MG/2ML IJ SOLN
INTRAMUSCULAR | Status: AC
  Filled 2022-05-25: qty 2

## 2022-05-25 MED ORDER — ACETAMINOPHEN 10 MG/ML IV SOLN
1000.0000 mg | Freq: Four times a day (QID) | INTRAVENOUS | Status: AC
  Administered 2022-05-25 – 2022-05-26 (×4): 1000 mg via INTRAVENOUS
  Filled 2022-05-25 (×3): qty 100

## 2022-05-25 MED ORDER — HYDROXYCHLOROQUINE SULFATE 200 MG PO TABS
200.0000 mg | ORAL_TABLET | ORAL | Status: DC
  Administered 2022-05-26: 200 mg via ORAL
  Filled 2022-05-25: qty 1

## 2022-05-25 MED ORDER — LIDOCAINE 2% (20 MG/ML) 5 ML SYRINGE
INTRAMUSCULAR | Status: DC | PRN
  Administered 2022-05-25: 60 mg via INTRAVENOUS

## 2022-05-25 MED ORDER — SCOPOLAMINE 1 MG/3DAYS TD PT72
MEDICATED_PATCH | TRANSDERMAL | Status: AC
  Filled 2022-05-25: qty 1

## 2022-05-25 MED ORDER — ONDANSETRON HCL 4 MG/2ML IJ SOLN
4.0000 mg | INTRAMUSCULAR | Status: DC | PRN
  Administered 2022-05-25 (×2): 4 mg via INTRAVENOUS
  Filled 2022-05-25 (×2): qty 2

## 2022-05-25 MED ORDER — ROCURONIUM BROMIDE 10 MG/ML (PF) SYRINGE
PREFILLED_SYRINGE | INTRAVENOUS | Status: AC
  Filled 2022-05-25: qty 10

## 2022-05-25 MED ORDER — TRAMADOL HCL 50 MG PO TABS
50.0000 mg | ORAL_TABLET | Freq: Four times a day (QID) | ORAL | Status: DC | PRN
  Administered 2022-05-25 – 2022-05-26 (×2): 50 mg via ORAL
  Filled 2022-05-25 (×2): qty 1

## 2022-05-25 MED ORDER — MIDAZOLAM HCL 2 MG/2ML IJ SOLN
INTRAMUSCULAR | Status: DC | PRN
  Administered 2022-05-25: 2 mg via INTRAVENOUS

## 2022-05-25 MED ORDER — LIDOCAINE HCL (PF) 2 % IJ SOLN
INTRAMUSCULAR | Status: AC
  Filled 2022-05-25: qty 5

## 2022-05-25 MED ORDER — BUPIVACAINE LIPOSOME 1.3 % IJ SUSP
INTRAMUSCULAR | Status: DC | PRN
  Administered 2022-05-25: 20 mL

## 2022-05-25 SURGICAL SUPPLY — 77 items
APPLICATOR SURGIFLO ENDO (HEMOSTASIS) ×1 IMPLANT
BAG COUNTER SPONGE SURGICOUNT (BAG) IMPLANT
CHLORAPREP W/TINT 26 (MISCELLANEOUS) ×1 IMPLANT
CLIP LIGATING HEM O LOK PURPLE (MISCELLANEOUS) ×2 IMPLANT
CLIP LIGATING HEMO LOK XL GOLD (MISCELLANEOUS) IMPLANT
CLIP LIGATING HEMO O LOK GREEN (MISCELLANEOUS) ×1 IMPLANT
CLIP SUT LAPRA TY ABSORB (SUTURE) ×1 IMPLANT
COVER SURGICAL LIGHT HANDLE (MISCELLANEOUS) ×1 IMPLANT
COVER TIP SHEARS 8 DVNC (MISCELLANEOUS) ×1 IMPLANT
COVER TIP SHEARS 8MM DA VINCI (MISCELLANEOUS) ×1
CUTTER ECHEON FLEX ENDO 45 340 (ENDOMECHANICALS) IMPLANT
DERMABOND ADVANCED .7 DNX12 (GAUZE/BANDAGES/DRESSINGS) ×1 IMPLANT
DRAIN CHANNEL 15F RND FF 3/16 (WOUND CARE) ×1 IMPLANT
DRAPE ARM DVNC X/XI (DISPOSABLE) ×4 IMPLANT
DRAPE COLUMN DVNC XI (DISPOSABLE) ×1 IMPLANT
DRAPE DA VINCI XI ARM (DISPOSABLE) ×4
DRAPE DA VINCI XI COLUMN (DISPOSABLE) ×1
DRAPE INCISE IOBAN 66X45 STRL (DRAPES) ×1 IMPLANT
DRAPE SHEET LG 3/4 BI-LAMINATE (DRAPES) ×1 IMPLANT
DRSG TEGADERM 2-3/8X2-3/4 SM (GAUZE/BANDAGES/DRESSINGS) IMPLANT
DRSG TEGADERM 4X4.75 (GAUZE/BANDAGES/DRESSINGS) ×1 IMPLANT
ELECT PENCIL ROCKER SW 15FT (MISCELLANEOUS) ×1 IMPLANT
ELECT REM PT RETURN 15FT ADLT (MISCELLANEOUS) ×1 IMPLANT
EVACUATOR SILICONE 100CC (DRAIN) ×1 IMPLANT
GAUZE 4X4 16PLY ~~LOC~~+RFID DBL (SPONGE) ×1 IMPLANT
GAUZE SPONGE 2X2 STRL 8-PLY (GAUZE/BANDAGES/DRESSINGS) ×1 IMPLANT
GAUZE SPONGE 4X4 12PLY STRL (GAUZE/BANDAGES/DRESSINGS) IMPLANT
GLOVE BIO SURGEON STRL SZ 6.5 (GLOVE) ×1 IMPLANT
GLOVE SURG LX STRL 7.5 STRW (GLOVE) ×2 IMPLANT
GOWN SRG XL LVL 4 BRTHBL STRL (GOWNS) ×1 IMPLANT
GOWN STRL NON-REIN XL LVL4 (GOWNS) ×1
GOWN STRL REUS W/ TWL XL LVL3 (GOWN DISPOSABLE) ×2 IMPLANT
GOWN STRL REUS W/TWL XL LVL3 (GOWN DISPOSABLE) ×2
HEMOSTAT SURGICEL 4X8 (HEMOSTASIS) ×1 IMPLANT
HOLDER FOLEY CATH W/STRAP (MISCELLANEOUS) ×1 IMPLANT
IRRIG SUCT STRYKERFLOW 2 WTIP (MISCELLANEOUS) ×1
IRRIGATION SUCT STRKRFLW 2 WTP (MISCELLANEOUS) ×1 IMPLANT
KIT BASIN OR (CUSTOM PROCEDURE TRAY) ×1 IMPLANT
KIT TURNOVER KIT A (KITS) IMPLANT
LOOP VESSEL MAXI BLUE (MISCELLANEOUS) ×1 IMPLANT
MARKER SKIN DUAL TIP RULER LAB (MISCELLANEOUS) ×1 IMPLANT
NDL INSUFFLATION 14GA 120MM (NEEDLE) ×1 IMPLANT
NEEDLE HYPO 22GX1.5 SAFETY (NEEDLE) ×1 IMPLANT
NEEDLE INSUFFLATION 14GA 120MM (NEEDLE) ×1 IMPLANT
PORT ACCESS TROCAR AIRSEAL 12 (TROCAR) ×1 IMPLANT
PROTECTOR NERVE ULNAR (MISCELLANEOUS) ×2 IMPLANT
RELOAD STAPLE 45 2.6 WHT THIN (STAPLE) IMPLANT
SCISSORS LAP 5X45 EPIX DISP (ENDOMECHANICALS) IMPLANT
SEAL CANN UNIV 5-8 DVNC XI (MISCELLANEOUS) ×4 IMPLANT
SEAL XI 5MM-8MM UNIVERSAL (MISCELLANEOUS) ×4
SET TRI-LUMEN FLTR TB AIRSEAL (TUBING) ×1 IMPLANT
SOL ELECTROSURG ANTI STICK (MISCELLANEOUS) ×1
SOLUTION ELECTROSURG ANTI STCK (MISCELLANEOUS) ×1 IMPLANT
SPIKE FLUID TRANSFER (MISCELLANEOUS) ×1 IMPLANT
SPONGE T-LAP 4X18 ~~LOC~~+RFID (SPONGE) ×1 IMPLANT
STAPLE RELOAD 45 WHT (STAPLE) IMPLANT
STAPLE RELOAD 45MM WHITE (STAPLE)
SURGIFLO W/THROMBIN 8M KIT (HEMOSTASIS) ×1 IMPLANT
SUT ETHILON 3 0 PS 1 (SUTURE) ×1 IMPLANT
SUT MNCRL AB 4-0 PS2 18 (SUTURE) ×2 IMPLANT
SUT PDS AB 1 CT1 27 (SUTURE) ×2 IMPLANT
SUT V-LOC BARB 180 2/0GR6 GS22 (SUTURE)
SUT VIC AB 0 CT1 27 (SUTURE) ×4
SUT VIC AB 0 CT1 27XBRD ANTBC (SUTURE) ×4 IMPLANT
SUT VIC AB 2-0 SH 27 (SUTURE) ×2
SUT VIC AB 2-0 SH 27X BRD (SUTURE) ×2 IMPLANT
SUT VLOC BARB 180 ABS3/0GR12 (SUTURE) ×1
SUTURE V-LC BRB 180 2/0GR6GS22 (SUTURE) IMPLANT
SUTURE VLOC BRB 180 ABS3/0GR12 (SUTURE) ×1 IMPLANT
SYS BAG RETRIEVAL 10MM (BASKET) ×1
SYSTEM BAG RETRIEVAL 10MM (BASKET) ×1 IMPLANT
TOWEL OR 17X26 10 PK STRL BLUE (TOWEL DISPOSABLE) ×1 IMPLANT
TRAY FOLEY MTR SLVR 16FR STAT (SET/KITS/TRAYS/PACK) ×1 IMPLANT
TRAY LAPAROSCOPIC (CUSTOM PROCEDURE TRAY) ×1 IMPLANT
TROCAR Z THREAD OPTICAL 12X100 (TROCAR) ×1 IMPLANT
TROCAR Z-THREAD OPTICAL 5X100M (TROCAR) IMPLANT
WATER STERILE IRR 1000ML POUR (IV SOLUTION) ×2 IMPLANT

## 2022-05-25 NOTE — Discharge Instructions (Addendum)

## 2022-05-25 NOTE — H&P (Signed)
Margaret Anthony is an 52 y.o. female.    Chief Complaint: Pre-Op RIGHT Partial Nephrectomy  HPI:   1 - Right Renal Mass - 2cm Rt lower anterior enhancing mass incidental on CT 03/2022 after fall. Mass is solitary, 80% exophytic. 1 artery / 1 vein (likely lumbar below artery) right renovascular anatomy.   2 - Punctate Renal Stone - <78m (too small to measure) incidetnal left papillary tip calcification on CT 2023. NO h/o colic.   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 Her PCP is JGeorgie ChardMD with [facility].   Today " Margaret Anthony" is seen to proceed with RIGHT robotic partial nephrecotmy. No interval fevers. Hgb 14, Cr 0.67 most recently.  Past Medical History:  Diagnosis Date   Anxiety    Dysrhythmia    pcs- takes Diltiazem   GERD (gastroesophageal reflux disease)    Headache    Heart murmur    hx of years ago then another echo and none now per last echo   History of kidney stones    Hypertension    [place]    has PTT anticoagulant antibody   PONV (postoperative nausea and vomiting)     Past Surgical History:  Procedure Laterality Date   BREAST BIOPSY Left 01/20/2021   u[place]oreabx/ coil clip/Benign Mammary Parenchyma with Mild Stromal Fibrosis/focal PASH   CESAREAN SECTION     x 3   HERNIA REPAIR     x 2   [ilonidal cyst       Family History  Problem Relation Age of Onset   Breast cancer Neg Hx    Social History:  reports that she has never smoked. She has never used smokeless tobacco. She reports that she does not drink alcohol and does not use drugs.  Allergies:  Allergies  Allergen Reactions   Hydrocodone-Acetaminophen Nausea And Vomiting   Oxycodone-Acetaminophen Nausea And Vomiting   Topiramate Other (See Comments)    Rhabdomyolysis (break down of muscle fibers)    No medications prior to admission.     No results found for this or any previous visit (from the past 48 hour(s)). No results found.  Review of Systems  Constitutional:  Negative for chills and fatigue.  All other systems reviewed and are negative.   There were no vitals taken for this visit. Physical Exam Vitals reviewed.  HENT:     Head: Normocephalic.  Eyes:     Pupils: Pupils are equal, round, and reactive to light.  Cardiovascular:     Rate and Rhythm: Normal rate.  Pulmonary:     Effort: Pulmonary effort is normal.  Abdominal:     General: Abdomen is flat.     Comments: Stable moderate truncal obesity, piro lower transverse scar w/o hernias.   Genitourinary:    Comments: No CVAT at present Musculoskeletal:        General: Normal range of motion.     Cervical back: Normal range of motion.  Neurological:     General: No focal deficit present.     Mental Status: She is alert.  Psychiatric:        Mood and Affect: Mood normal.      Assessment/Plan  Proceed as planned with RIGHT robotic partial nephrectomy. Risks,benefits, alternatives, expected peri-op course discussed previously and reiterated today.   TAlexis Frock MD 05/25/2022, 5:30 AM

## 2022-05-25 NOTE — Transfer of Care (Signed)
Immediate Anesthesia Transfer of Care Note  Patient: Margaret Anthony  Procedure(s) Performed: XI ROBOTIC ASSITED PARTIAL NEPHRECTOMY (Right: Abdomen)  Patient Location: PACU  Anesthesia Type:General  Level of Consciousness: drowsy  Airway & Oxygen Therapy: Patient Spontanous Breathing and Patient connected to face mask oxygen  Post-op Assessment: Report given to RN and Post -op Vital signs reviewed and stable  Post vital signs: Reviewed and stable  Last Vitals:  Vitals Value Taken Time  BP 111/58 05/25/22 1341  Temp    Pulse 58 05/25/22 1343  Resp 19 05/25/22 1343  SpO2 100 % 05/25/22 1343  Vitals shown include unvalidated device data.  Last Pain:  Vitals:   05/25/22 1044  TempSrc: Oral  PainSc:          Complications: No notable events documented.

## 2022-05-25 NOTE — Brief Op Note (Signed)
05/25/2022  1:25 PM  PATIENT:  Margaret Anthony  52 y.o. female  PRE-OPERATIVE DIAGNOSIS:  RIGHT RENAL MASS  POST-OPERATIVE DIAGNOSIS:  right renal mass  PROCEDURE:  Procedure(s): XI ROBOTIC ASSITED PARTIAL NEPHRECTOMY (Right)  SURGEON:  Surgeon(s) and Role:    Alexis Frock, MD - Primary  PHYSICIAN ASSISTANT:   ASSISTANTS: Clemetine Marker PA   ANESTHESIA:   general  EBL:  50 mL   BLOOD ADMINISTERED:none  DRAINS:  1- JP to bulb; 2- Foley to gravity    LOCAL MEDICATIONS USED:  MARCAINE     SPECIMEN:  Source of Specimen:  Rt renal mass  DISPOSITION OF SPECIMEN:  PATHOLOGY  COUNTS:  YES  TOURNIQUET:  * No tourniquets in log *  DICTATION: .Other Dictation: Dictation Number [id number]  PLAN OF CARE: Admit for overnight observation  PATIENT DISPOSITION:  PACU - hemodynamically stable.   Delay start of Pharmacological VTE agent (>24hrs) due to surgical blood loss or risk of bleeding: yes

## 2022-05-25 NOTE — Anesthesia Procedure Notes (Signed)
Procedure Name: Intubation Date/Time: 05/25/2022 11:30 AM  Performed by: Sharlette Dense, CRNAPre-anesthesia Checklist: Patient identified, Emergency Drugs available, Suction available and Patient being monitored Patient Re-evaluated:Patient Re-evaluated prior to induction Oxygen Delivery Method: Circle system utilized Preoxygenation: Pre-oxygenation with 100% oxygen Induction Type: IV induction Ventilation: Mask ventilation without difficulty Laryngoscope Size: Miller and 2 Grade View: Grade I Tube type: Oral Tube size: 7.5 mm Number of attempts: 1 Airway Equipment and Method: Stylet Placement Confirmation: ETT inserted through vocal cords under direct vision, positive ETCO2 and breath sounds checked- equal and bilateral Secured at: 21 cm Tube secured with: Tape Dental Injury: Teeth and Oropharynx as per pre-operative assessment

## 2022-05-25 NOTE — Anesthesia Postprocedure Evaluation (Signed)
Anesthesia Post Note  Patient: Margaret Anthony  Procedure(s) Performed: XI ROBOTIC ASSITED PARTIAL NEPHRECTOMY (Right: Abdomen)     Patient location during evaluation: PACU Anesthesia Type: General Level of consciousness: awake and alert Pain management: pain level controlled Vital Signs Assessment: post-procedure vital signs reviewed and stable Respiratory status: spontaneous breathing, nonlabored ventilation, respiratory function stable and patient connected to nasal cannula oxygen Cardiovascular status: blood pressure returned to baseline and stable Postop Assessment: no apparent nausea or vomiting Anesthetic complications: no  No notable events documented.  Last Vitals:  Vitals:   05/25/22 1345 05/25/22 1430  BP: 130/65 (!) 155/92  Pulse: 60 (!) 55  Resp: 20 16  Temp:    SpO2: 100% 100%    Last Pain:  Vitals:   05/25/22 1440  TempSrc:   PainSc: Asleep                 Jamone Garrido,W. EDMOND

## 2022-05-25 NOTE — Op Note (Signed)
NAMESHONTAI, VANDOVER MEDICAL RECORD NO: [id number] ACCOUNT NO: [id number] DATE OF BIRTH: 1970-04-14 FACILITY: [facility]ATION: [id number] PHYSICIAN: Alexis Frock, MD  Operative Report   DATE OF PROCEDURE: 05/25/2022  PREOPERATIVE DIAGNOSIS:  Right renal mass.  PROCEDURE: 1.  Robotic-assisted laparoscopic partial nephrectomy. 2.  Intraoperative ultrasound interpretation.  ESTIMATED BLOOD LOSS:  50 mL  ASSISTANT:  Debbrah Alar, PA  FINDINGS:   1.  Single early branching artery to vein, right renovascular anatomy. 2.  Predominantly exophytic solid right renal mass with intraoperative ultrasound.  DRAINS:   1.  Jackson-Pratt drain to bulb suction. 2.  Foley to straight drain.  SPECIMEN:  Right renal mass.  INDICATIONS:  This is a very pleasant 52 year old lady who was found incidentally on spine imaging to have a enhancing solid right renal mass concerning for localized stage I renal cell carcinoma.  Options were discussed for management including  surveillance protocols versus ablative therapy versus surgical extirpation and she adamantly wished to proceed with right partial nephrectomy with curative intent.  She presents for this today.  Informed consent was obtained and placed in medical record.  PROCEDURE IN DETAIL:  The patient being identified and verified, procedure being robotic right partial nephrectomy was confirmed.  Procedure timeout was performed.  Intravenous antibiotics were administered.  General endotracheal anesthesia induced.  The  patient placed into a right side up full flank position pulling 15 degrees of table flexion, superior arm elevator, axillary roll, sequential compression devices, bottom leg bent, top leg straight.  Foley catheter was placed free to straight drain.  She  was further fastened to operating table using 3-inch tape with foam padding across supraxiphoid chest and her pelvis.  Sterile field was created by prepping and draping the entire right flank  and abdomen using chlorhexidine gluconate.  Next, a high  flow, low pressure pneumoperitoneum using Veress technique in the right lower quadrant, having passed aspiration and drop test.  An 8 mm robotic camera port was then placed in position approximately one-half handbreadths superolateral to the umbilicus.   Laparoscopic examination of peritoneal cavity revealed no significant adhesions, no visceral injury.  Additional ports were placed as follows:  Right subcostal 8 mm robotic port, right far lateral 8 mm robotic port, approximately 4 fingerbreadths  superomedial to the anterior iliac spine, right paramedian inferior robotic port, approximately 1 handbreadth superior to pubic ramus, two 12 mm assistant port sites in the midline, one in the supraumbilical location non-AirSeal type and another  approximately 4 fingerbreadths superior to this AirSeal type and a 5 mm port in the subxiphoid location through which a laparoscopic grasper was used to elevate the liver off of the anterior surface of Gerota's fascia.  Robot was docked and passed the  electronic checks.  Initial attention was directed at development of retroperitoneum.  Notably, the ascending colon was already lie medial to the kidney and just the lateral peritoneum lateral to this was incised and carefully rotated medially.  The  lower pole of the kidney area was identified.  The mass was clearly visible already was placed on lateral traction.  Dissection proceeded medial to this, the ureter was encountered and also placed on gentle lateral traction.  Psoas musculature was  identified and dissection proceeded within this triangle towards the area of the renal hilum.  Renal hilum consisted of a single artery early branching 2 vein renal vascular anatomy.  The artery was circumferentially mobilized and vessel loop was placed  such that there was excellent window for  clamping proximal to the artery splitting. Attention was then directed at  identification of the mass.  Dissection proceeded directly onto the anterior inferior parenchyma of the kidney.  Again, the mass was  clearly visible, grossly. It was circumferentially mobilized such that a rim of fat lie grossly on top of it and then intraoperative ultrasound was performed.  Intraoperative ultrasound was performed using a drop down probe, this revealed a solid and quite heterogeneous mass of the lower pole of the kidney.  The interface between the mass and the kidney was very diffuse by intraoperative ultrasound.  As such, a  purposely wide plane of partial nephrectomy was chosen.  This was scored circumferentially around the area of the right renal mass.  Next warm ischemia was achieved by placing 2 bulldog clamps on the artery proximal to it splitting and partial  nephrectomy was carefully performed using cold scissors keeping what appeared to be a rim of normal parenchymal tissue with the partial nephrectomy specimen, which was then set aside.  First layer renorrhaphy was performed using running 3-0 V-Loc  oversewing several small venous sinuses.  A Surgicel bolster was applied and second layer renorrhaphy was performed using interrupted 0 Vicryl sandwiched between Hem-o-loks and Lapra-Tys.  This resulted in excellent parenchymal apposition over the area  of the bolster.  Bulldogs were removed for a total warm ischemia time of 12 minutes.  Hemostasis was quite good.  SurgiFlo was placed over the area of partial nephrectomy with additional small amount of area of the hilum and the posterior peritoneum was  then reapproximated using running Vicryl over the area of renorrhaphy.  The vessel loop was removed.  Sponge and needle counts were correct.  We achieved the goals extirpated portion of procedure today.  Next, a closed suction drain was brought out the  previous lateral most robotic site into the peritoneal cavity.  Robot was then undocked.  The superior most assistant port site was  closed with fascia using Carter-Thomason suture passer and 0 Vicryl.  The inferior most assistant port site was expanded  to approximately 2 cm in length and the partial nephrectomy specimen was removed via this site set aside for permanent pathology.  This extraction site was closed at the level of fascia using figure-of-eight PDS x2 followed by reapproximation of Scarpa's  with running Vicryl.  All incision sites were infiltrated with dilute lipolyzed Marcaine and closed at the level of the skin using subcuticular Monocryl following Dermabond.  Procedure was then terminated.  The patient tolerated the procedure well, no  immediate perioperative complications.  The patient taken to postanesthesia care in stable condition.  Plan for observation admission.  Please note, first assistant, Debbrah Alar, was crucial for all portions of the surgery today.  She provided invaluable retraction, suctioning, vascular clamping, robotic instrument exchange, specimen manipulation, and general first assistance.   PUS D: 05/25/2022 1:33:29 pm T: 05/25/2022 4:56:00 pm  JOB: [id number]

## 2022-05-25 NOTE — Anesthesia Preprocedure Evaluation (Addendum)
Anesthesia Evaluation  Patient identified by MRN, date of birth, ID band Patient awake    Reviewed: Allergy & Precautions, H&P , NPO status , Patient's Chart, lab work & pertinent test results, reviewed documented beta blocker date and time   History of Anesthesia Complications (+) PONV and history of anesthetic complications  Airway Mallampati: III  TM Distance: >3 FB Neck ROM: Full    Dental no notable dental hx. (+) Teeth Intact, Dental Advisory Given   Pulmonary neg pulmonary ROS   Pulmonary exam normal breath sounds clear to auscultation       Cardiovascular hypertension, Pt. on medications and Pt. on home beta blockers + dysrhythmias  Rhythm:Regular Rate:Normal     Neuro/Psych  Headaches  Anxiety      negative psych ROS   GI/Hepatic Neg liver ROS,GERD  Medicated,,  Endo/Other  negative endocrine ROS    Renal/GU negative Renal ROS  negative genitourinary   Musculoskeletal   Abdominal   Peds  Hematology negative hematology ROS (+)   Anesthesia Other Findings   Reproductive/Obstetrics negative OB ROS                             Anesthesia Physical Anesthesia Plan  ASA: 2  Anesthesia Plan: General   Post-op Pain Management: Tylenol PO (pre-op)* and Toradol IV (intra-op)*   Induction: Intravenous  PONV Risk Score and Plan: 4 or greater and Ondansetron, Dexamethasone, Scopolamine patch - Pre-op and Midazolam  Airway Management Planned: Oral ETT  Additional Equipment:   Intra-op Plan:   Post-operative Plan: Extubation in OR  Informed Consent: I have reviewed the patients History and Physical, chart, labs and discussed the procedure including the risks, benefits and alternatives for the proposed anesthesia with the patient or authorized representative who has indicated his/her understanding and acceptance.     Dental advisory given  Plan Discussed with: CRNA  Anesthesia  Plan Comments:        Anesthesia Quick Evaluation

## 2022-05-25 NOTE — Plan of Care (Signed)
  Problem: Education: Goal: Knowledge of the prescribed therapeutic regimen will improve Outcome: Progressing   Problem: Clinical Measurements: Goal: Postoperative complications will be avoided or minimized Outcome: Progressing

## 2022-05-26 ENCOUNTER — Encounter (HOSPITAL_COMMUNITY): Payer: Self-pay | Admitting: Urology

## 2022-05-26 ENCOUNTER — Telehealth: Payer: Self-pay | Admitting: Plastic Surgery

## 2022-05-26 DIAGNOSIS — C641 Malignant neoplasm of right kidney, except renal pelvis: Secondary | ICD-10-CM | POA: Diagnosis not present

## 2022-05-26 LAB — BASIC METABOLIC PANEL
Anion gap: 8 (ref 5–15)
BUN: 8 mg/dL (ref 6–20)
CO2: 25 mmol/L (ref 22–32)
Calcium: 9 mg/dL (ref 8.9–10.3)
Chloride: 102 mmol/L (ref 98–111)
Creatinine, Ser: 0.7 mg/dL (ref 0.44–1.00)
GFR, Estimated: >60 mL/min (ref >60)
Glucose, Bld: 142 mg/dL — ABNORMAL HIGH (ref 70–99)
Potassium: 4.1 mmol/L (ref 3.5–5.1)
Sodium: 135 mmol/L (ref 135–145)

## 2022-05-26 LAB — SURGICAL PATHOLOGY

## 2022-05-26 LAB — HEMOGLOBIN AND HEMATOCRIT, BLOOD
HCT: 36.2 % (ref 36.0–46.0)
Hemoglobin: 11.8 g/dL — ABNORMAL LOW (ref 12.0–15.0)

## 2022-05-26 NOTE — Progress Notes (Signed)
Per unit RN pt was wanting PIV in a different place on her arm due to pump beeping. Pt reports that since IV team was consulted pump has been running without difficulty. For now pt wants to leave PIV in [facility] and she will notify unit RN if this changes. Advised unit RN to consult if pt changes her mind

## 2022-05-26 NOTE — Telephone Encounter (Signed)
Per Kristy/[employer], procedure approved verbally Auth# [id number].  Still waiting for print off verification.

## 2022-05-26 NOTE — Discharge Summary (Signed)
[facility] Discharge Summary  Admit date: 05/25/2022  Discharge date and time: 05/26/22   Discharge to: Home  Discharge Service: Urology  Discharge Attending Physician:  Dr. Tresa Moore  Discharge  Diagnoses: Renal mass  Secondary Diagnosis: Principal Problem:   Renal mass   OR Procedures: Procedure(s): XI ROBOTIC ASSITED PARTIAL NEPHRECTOMY 05/25/2022   Ancillary Procedures: None   Discharge Day Services: The patient was seen and examined by the Urology team both in the morning and immediately prior to discharge.  Vital signs and laboratory values were stable and within normal limits.  The physical exam was benign and unchanged and all surgical wounds were examined.  Discharge instructions were explained and all questions answered.  Subjective  No acute events overnight. Pain Controlled. No fever or chills.  Objective Patient Vitals for the past 8 hrs:  BP Temp Temp src Pulse Resp SpO2  05/26/22 1244 (!) 99/58 98.7 F (37.1 C) Oral 72 18 96 %  05/26/22 1100 (!) 104/57 -- -- -- -- --  05/26/22 1019 (!) 90/49 -- -- -- -- --  05/26/22 0814 (!) 103/57 98.4 F (36.9 C) Oral 61 (!) 22 94 %   Total I/O In: -  Out: 650 [Urine:650]  General Appearance:        No acute distress Lungs:                       Normal work of breathing on room air Heart:                                Regular rate and rhythm Abdomen:                         Soft, non-tender, non-distended. Voiding spontaneously. Incisions clean/dry/intact Extremities:                      Warm and well perfused   Hospital Course:  The patient underwent right partial nephrectomy on 05/25/2022.  The patient tolerated the procedure well, was extubated in the OR, and afterwards was taken to the PACU for routine post-surgical care. When stable the patient was transferred to the floor.   The patient did well postoperatively.  The patient's diet was slowly advanced and at the time of discharge was tolerating a regular  diet.  The patient was discharged home 1 Day Post-Op, at which point was tolerating a regular solid diet, was able to void spontaneously, have adequate pain control with P.O. pain medication, and could ambulate without difficulty. Drain was removed prior to discharge. The patient will follow up with [place] for post op check. Pathology T1a, negative margins. Will be discussed with patient in clinic.  Condition at Discharge: Improved  Discharge Medications:  Allergies as of 05/26/2022       Reactions   Hydrocodone-acetaminophen Nausea And Vomiting   Oxycodone-acetaminophen Nausea And Vomiting   Topiramate Other (See Comments)   Rhabdomyolysis (break down of muscle fibers)        Medication List     STOP taking these medications    aspirin EC 81 MG tablet   Biotin 10000 MCG Tabs   Calcium Carb-Cholecalciferol 600-10 MG-MCG Tabs   ibuprofen 200 MG tablet Commonly known as: ADVIL   Vitamin D3 10 MCG (400 UNIT) Tabs tablet       TAKE these medications    acetaminophen 500 MG  tablet Commonly known as: TYLENOL Take 1,000 mg by mouth every 6 (six) hours as needed for moderate pain.   Aimovig 70 MG/ML Soaj Generic drug: Erenumab-aooe INJECT 70 MG SUBCUTANEOUSLY EVERY 28 (TWENTY-EIGHT) DAYS   diltiazem 60 MG tablet Commonly known as: CARDIZEM Take 60 mg by mouth daily.   docusate sodium 100 MG capsule Commonly known as: COLACE Take 1 capsule (100 mg total) by mouth 2 (two) times daily.   fluticasone 50 MCG/ACT nasal spray Commonly known as: FLONASE Place 1 spray into both nostrils daily as needed for allergies or rhinitis.   hydroxychloroquine 200 MG tablet Commonly known as: PLAQUENIL Take 200 mg by mouth every other day.   magnesium oxide 400 (240 Mg) MG tablet Commonly known as: MAG-OX Take 1 tablet by mouth daily.   minoxidil 2.5 MG tablet Commonly known as: LONITEN Take 2.5 mg by mouth daily.   nystatin powder Commonly known as: MYCOSTATIN/NYSTOP Apply 1  Application topically 2 (two) times daily as needed (yeast rash).   omeprazole 20 MG capsule Commonly known as: PRILOSEC Take 20 mg by mouth daily.   propranolol 60 MG tablet Commonly known as: INDERAL Take 60 mg by mouth 2 (two) times daily.   SUMAtriptan 100 MG tablet Commonly known as: IMITREX Take 25-50 mg by mouth every 2 (two) hours as needed for migraine.   traMADol 50 MG tablet Commonly known as: Ultram Take 1-2 tablets (50-100 mg total) by mouth every 6 (six) hours as needed for moderate pain or severe pain.   triamcinolone cream 0.1 % Commonly known as: KENALOG Apply 1 Application topically 2 (two) times daily as needed (rash).   Ubrelvy 100 MG Tabs Generic drug: Ubrogepant Take 100 mg by mouth daily as needed (migraines).

## 2022-05-26 NOTE — Plan of Care (Signed)
  Problem: Education: Goal: Knowledge of the prescribed therapeutic regimen will improve Outcome: Progressing   Problem: Clinical Measurements: Goal: Postoperative complications will be avoided or minimized Outcome: Progressing   Problem: Education: Goal: Knowledge of General Education information will improve Description: Including pain rating scale, medication(s)/side effects and non-pharmacologic comfort measures Outcome: Progressing   Problem: Health Behavior/Discharge Planning: Goal: Ability to manage health-related needs will improve Outcome: Progressing

## 2022-05-29 LAB — TYPE AND SCREEN
ABO/RH(D): B POS
Antibody Screen: POSITIVE
Donor AG Type: NEGATIVE
Donor AG Type: NEGATIVE
Donor AG Type: NEGATIVE
Unit division: 0
Unit division: 0
Unit division: 0

## 2022-05-29 LAB — BPAM RBC
Blood Product Expiration Date: 202403022359
Blood Product Expiration Date: 202403032359
Blood Product Expiration Date: 202403102359
Unit Type and Rh: 5100
Unit Type and Rh: 7300
Unit Type and Rh: 7300

## 2022-06-13 ENCOUNTER — Telehealth: Payer: Self-pay | Admitting: Plastic Surgery

## 2022-06-13 NOTE — Telephone Encounter (Signed)
Patient called to report she is 3 weeks postop from kidney cancer surgery. Patient says she has met her deductible and out of pocket amounts with insurance so she would like to have the breast surgery this year while it is more cost affective for her. She says she has to get a 6 month Chest X-RAY and CAT scan per protocol for f/u after cancer surgery. Pt asking Donna Christen to review and let her know if she should schedule breast surgery in July sometime after the 4th or October 16th or 17th.

## 2022-06-13 NOTE — Telephone Encounter (Signed)
Hi Kanani,  Yes, I have already sent Heather and Joss the surgical route for this patient. She should be OK for either of those dates, but it's contingent upon Dr. Eusebio Friendly availability.    Heather/Joss, can you please assist?

## 2022-06-23 ENCOUNTER — Other Ambulatory Visit: Payer: Self-pay | Admitting: Obstetrics and Gynecology

## 2022-06-23 DIAGNOSIS — Z1231 Encounter for screening mammogram for malignant neoplasm of breast: Secondary | ICD-10-CM

## 2022-07-22 ENCOUNTER — Other Ambulatory Visit: Payer: Self-pay | Admitting: Obstetrics and Gynecology

## 2022-07-22 DIAGNOSIS — Z1231 Encounter for screening mammogram for malignant neoplasm of breast: Secondary | ICD-10-CM

## 2022-08-08 ENCOUNTER — Ambulatory Visit
Admission: RE | Admit: 2022-08-08 | Discharge: 2022-08-08 | Disposition: A | Discharge status: Home / Self Care | Payer: No Typology Code available for payment source | Source: Ambulatory Visit | Attending: Obstetrics and Gynecology | Admitting: Obstetrics and Gynecology

## 2022-08-08 DIAGNOSIS — Z1231 Encounter for screening mammogram for malignant neoplasm of breast: Secondary | ICD-10-CM | POA: Insufficient documentation

## 2022-08-17 DIAGNOSIS — Z85528 Personal history of other malignant neoplasm of kidney: Secondary | ICD-10-CM | POA: Insufficient documentation

## 2022-08-26 ENCOUNTER — Ambulatory Visit (INDEPENDENT_AMBULATORY_CARE_PROVIDER_SITE_OTHER): Payer: 59 | Admitting: Plastic Surgery

## 2022-08-26 ENCOUNTER — Encounter: Payer: Self-pay | Admitting: Plastic Surgery

## 2022-08-26 VITALS — BP 134/89 | HR 72 | Ht 63.5 in | Wt 170.0 lb

## 2022-08-26 DIAGNOSIS — M542 Cervicalgia: Secondary | ICD-10-CM

## 2022-08-26 DIAGNOSIS — G8929 Other chronic pain: Secondary | ICD-10-CM

## 2022-08-26 DIAGNOSIS — N62 Hypertrophy of breast: Secondary | ICD-10-CM

## 2022-08-26 DIAGNOSIS — M546 Pain in thoracic spine: Secondary | ICD-10-CM

## 2022-08-26 NOTE — Progress Notes (Signed)
   Subjective:    Patient ID: Margaret Anthony, female    DOB: Mar 19, 1971, 52 y.o.   MRN: [id number]  The patient is a 52 year old female here with her daughter for follow-up for evaluation for breast reduction.  She has grooves and pain in her shoulder and her back as well as in her neck.  She has been very diligent about losing weight over the past several months.  She is 5 feet 3 inches tall and weighs 170 pounds.  Her body mass index is 30.1 kg/m.  The estimated amount of tissue that would need to be removed is between 450 and 500 g from each breast.  Her sternal notch to nipple distance was measured at 35 cm on the right and 36 cm on the left.  She considers herself a DDD cup and would like to be around a C cup.  Since her last visit she was diagnosed with renal cancer and had a partial nephrectomy.  She is cleared from that stand point.      Review of Systems  Constitutional: Negative.   HENT: Negative.    Eyes: Negative.   Respiratory: Negative.    Cardiovascular: Negative.   Gastrointestinal: Negative.   Endocrine: Negative.   Genitourinary: Negative.   Musculoskeletal:  Positive for back pain and neck pain.       Objective:   Physical Exam Vitals and nursing note reviewed.  Constitutional:      Appearance: Normal appearance.  HENT:     Head: Normocephalic and atraumatic.  Cardiovascular:     Rate and Rhythm: Normal rate.     Pulses: Normal pulses.  Pulmonary:     Effort: Pulmonary effort is normal.  Abdominal:     Palpations: Abdomen is soft.  Skin:    General: Skin is warm.     Capillary Refill: Capillary refill takes less than 2 seconds.  Neurological:     Mental Status: She is alert and oriented to person, place, and time.  Psychiatric:        Mood and Affect: Mood normal.        Behavior: Behavior normal.        Thought Content: Thought content normal.        Judgment: Judgment normal.         Assessment & Plan:     ICD-10-CM   1. Symptomatic mammary  hypertrophy  N62     2. Chronic bilateral thoracic back pain  M54.6    G89.29       Plan for all breast reduction with possible liposuction.  She understands the risks and postoperative timeframe.

## 2022-09-07 LAB — COLOGUARD: COLOGUARD: NEGATIVE

## 2022-11-03 ENCOUNTER — Telehealth: Payer: Self-pay | Admitting: Plastic Surgery

## 2022-11-03 NOTE — Telephone Encounter (Signed)
Pt called and stated her ins name changed to [facility] TIA same ins card number just name, and that they are requesting more records for sx, pt just called. She said she received a letter in the mail and stated she didn't know if she needed to tell us or if they will also send that information to [place].

## 2022-11-11 ENCOUNTER — Ambulatory Visit: Payer: No Typology Code available for payment source | Admitting: Plastic Surgery

## 2022-12-13 ENCOUNTER — Telehealth: Payer: Self-pay | Admitting: Plastic Surgery

## 2022-12-13 NOTE — Telephone Encounter (Signed)
Patient called and was inquiring about the status of her surgery. She stated that insurance needed more information but she wanted to know if it had been approved yet.  Please call her at [phone]

## 2022-12-14 ENCOUNTER — Other Ambulatory Visit (HOSPITAL_COMMUNITY): Payer: Self-pay | Admitting: Urology

## 2022-12-14 ENCOUNTER — Ambulatory Visit (HOSPITAL_COMMUNITY)
Admission: RE | Admit: 2022-12-14 | Discharge: 2022-12-14 | Disposition: A | Discharge status: Home / Self Care | Payer: No Typology Code available for payment source | Source: Ambulatory Visit | Attending: Urology | Admitting: Urology

## 2022-12-14 DIAGNOSIS — C641 Malignant neoplasm of right kidney, except renal pelvis: Secondary | ICD-10-CM

## 2023-01-29 IMAGING — MG DIGITAL DIAGNOSTIC BILAT W/ TOMO W/ CAD
8 of 11 series · 8 of 23 positions shown · non-contrast
Comparison: Previous exam(s).

CLINICAL DATA: History of benign left breast ultrasound-guided core
needle biopsy of left breast 2 o'clock mass demonstrating mild focal
stromal fibrosis and PASH.

EXAM:
DIGITAL DIAGNOSTIC BILATERAL MAMMOGRAM WITH TOMOSYNTHESIS AND CAD;
ULTRASOUND LEFT BREAST LIMITED
TECHNIQUE: Bilateral digital diagnostic mammography and breast tomosynthesis
was performed. The images were evaluated with computer-aided
detection.; Targeted ultrasound examination of the left breast was
performed.

[R XCCL synth-2D]
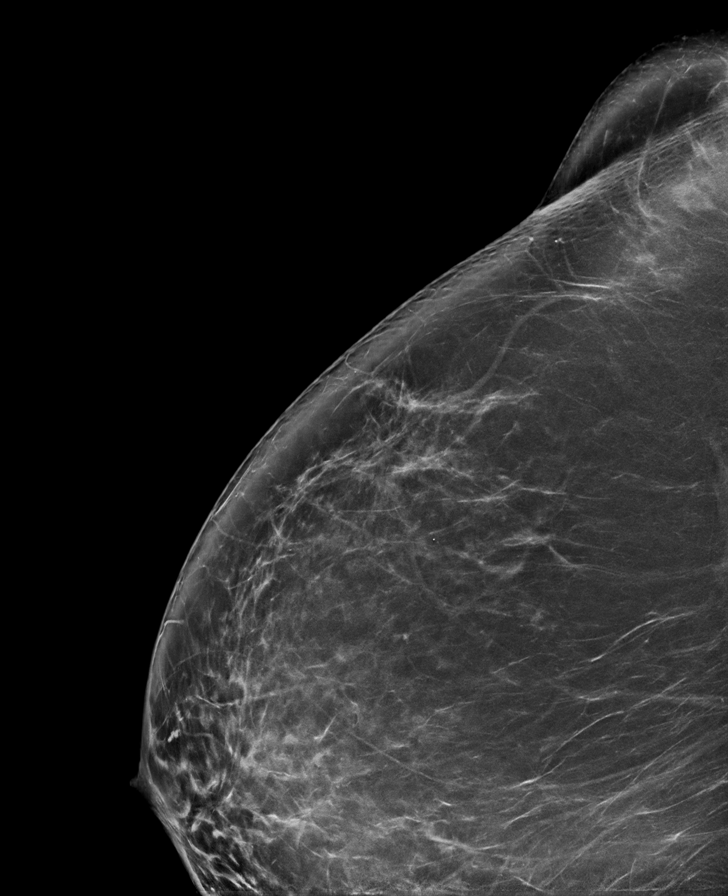

[L MLO synth-2D (1 of 2)]
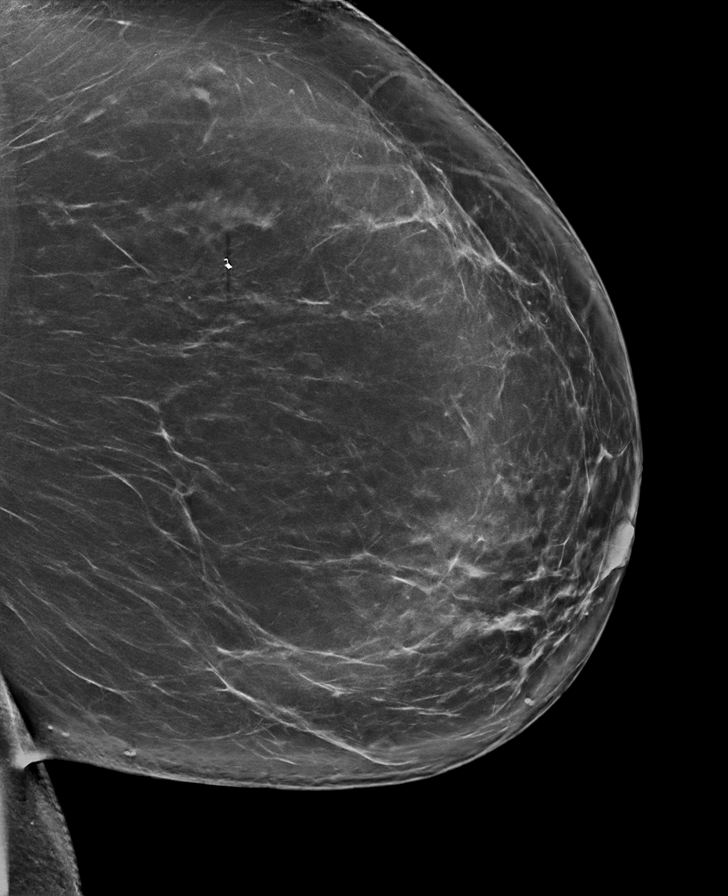

[L CC synth-2D]
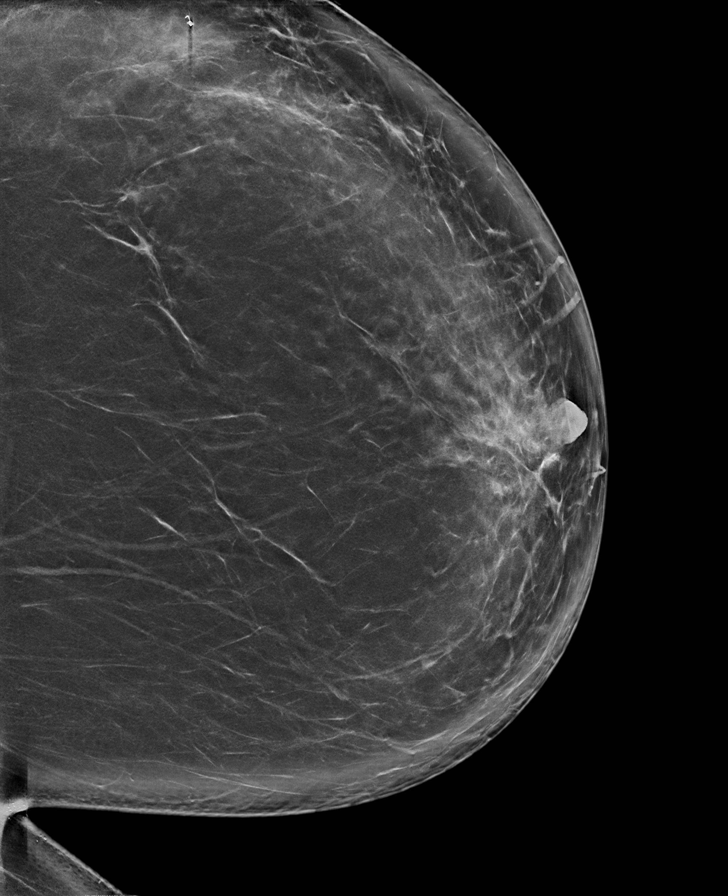

[R MLO synth-2D (1 of 2)]
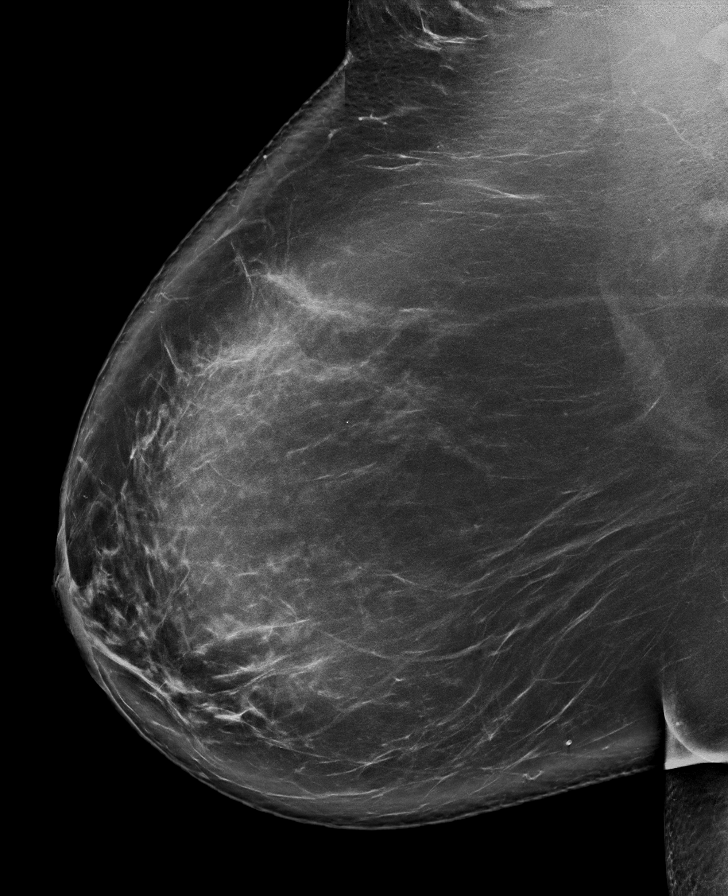

[R CC synth-2D]
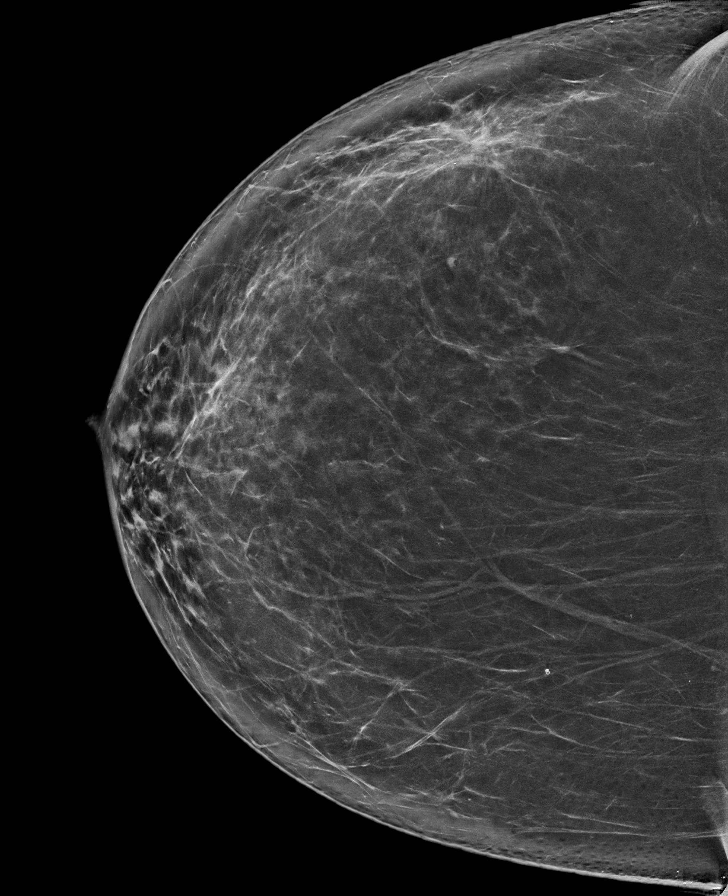

[R MLO synth-2D (2 of 2)]
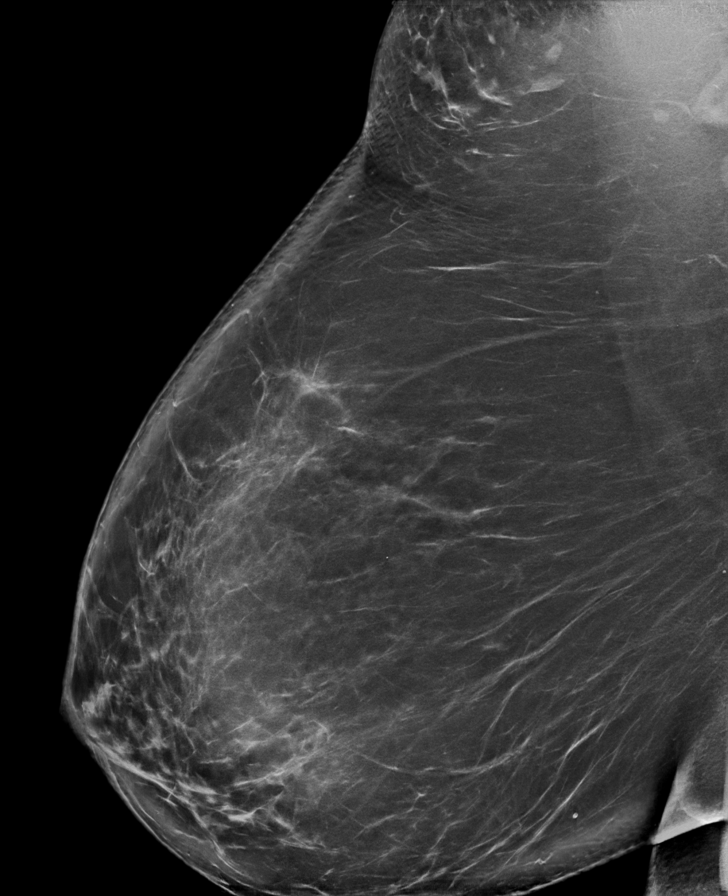

[L XCCL synth-2D]
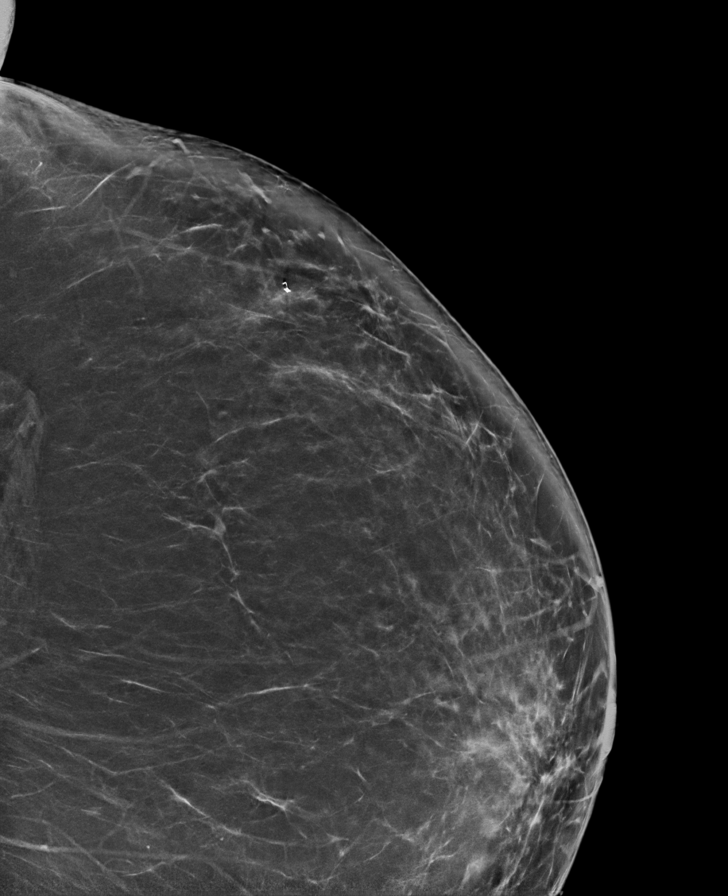

[L MLO synth-2D (2 of 2)]
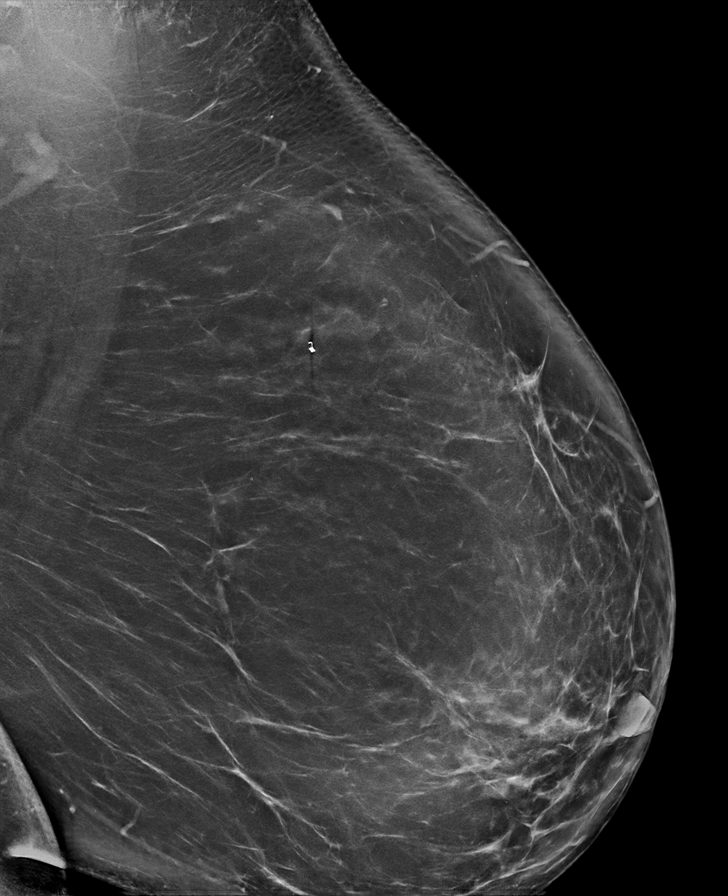

[8 of 23 positions shown; findings below may reference images not displayed]

ACR Breast Density Category b: There are scattered areas of
fibroglandular density.
FINDINGS: Mammographically, there are no new suspicious masses, areas of
architectural distortion or microcalcifications in either breast.
Coil shaped post biopsy marker is within the site of the prior
benign core needle biopsy in the left breast upper outer quadrant.

Targeted left breast ultrasound is performed demonstrating 2 o'clock
16 cm from the nipple stable circumscribed isoechoic mass measuring
0.7 x 0.4 x 0.6 cm. This corresponds to the biopsy-proven benign
mass.
IMPRESSION: No mammographic or sonographic evidence of breast malignancy.

RECOMMENDATION:
Screening mammogram in one year.(Code:[id number])

I have discussed the findings and recommendations with the patient.
If applicable, a reminder letter will be sent to the patient
regarding the next appointment.

BI-RADS CATEGORY  2: Benign.

## 2023-01-29 IMAGING — US US BREAST*L* LIMITED INC AXILLA
1 series · 6 of 6 positions shown · non-contrast
Comparison: Previous exam(s).

CLINICAL DATA: History of benign left breast ultrasound-guided core
needle biopsy of left breast 2 o'clock mass demonstrating mild focal
stromal fibrosis and PASH.

EXAM:
DIGITAL DIAGNOSTIC BILATERAL MAMMOGRAM WITH TOMOSYNTHESIS AND CAD;
ULTRASOUND LEFT BREAST LIMITED
TECHNIQUE: Bilateral digital diagnostic mammography and breast tomosynthesis
was performed. The images were evaluated with computer-aided
detection.; Targeted ultrasound examination of the left breast was
performed.

[Series 1: us breast*left* limited inc axilla · 0.06mm/px · 6 of 6 slices shown]
[im 1/6]
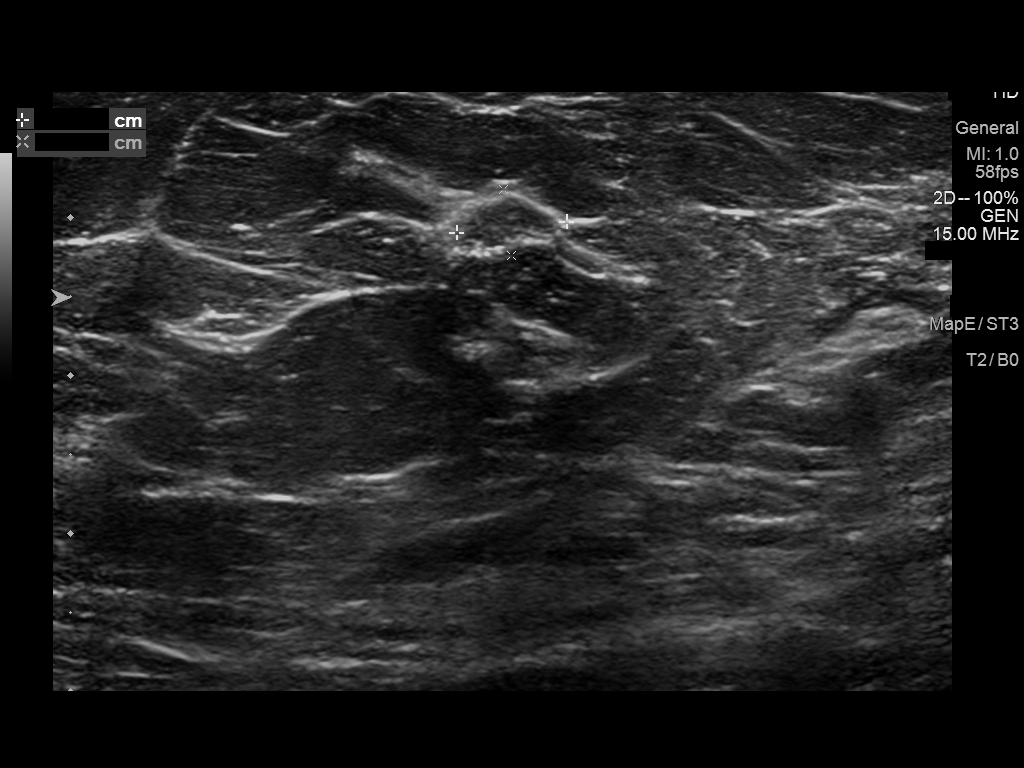
[im 2/6]
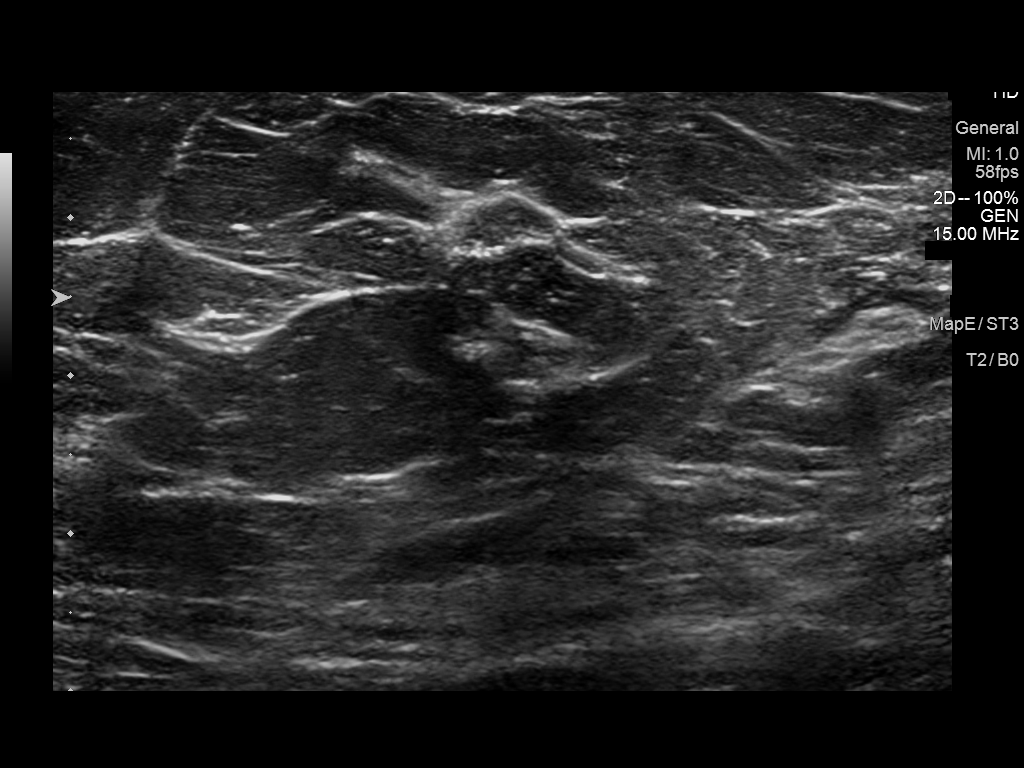
[im 3/6]
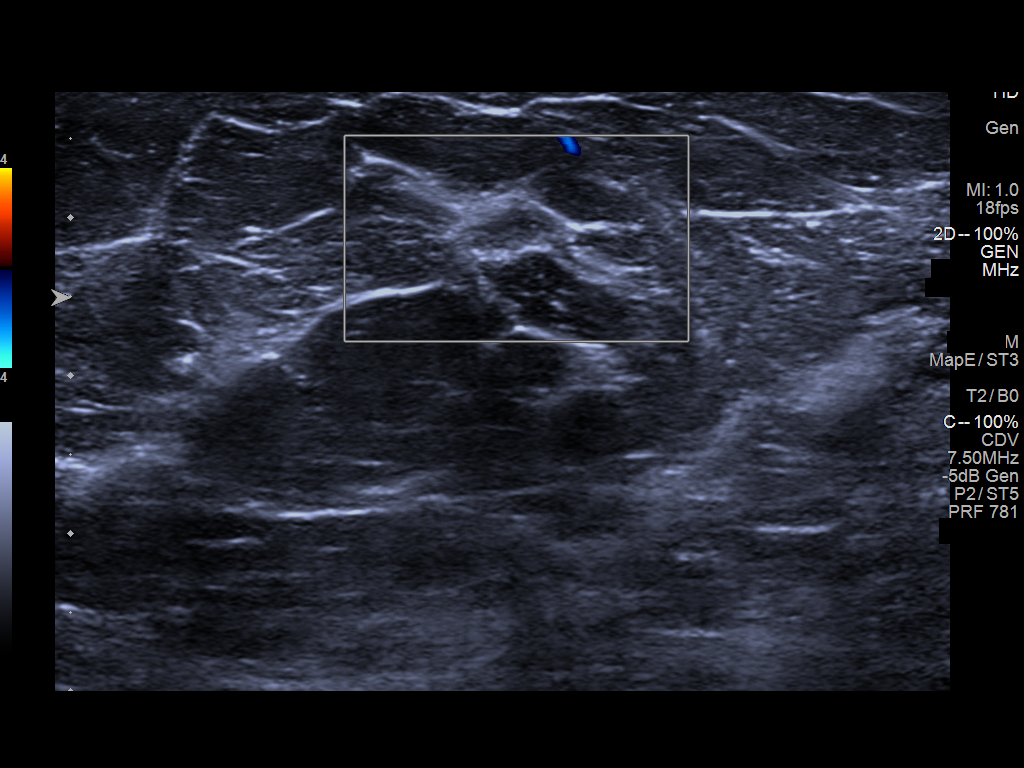
[im 4/6]
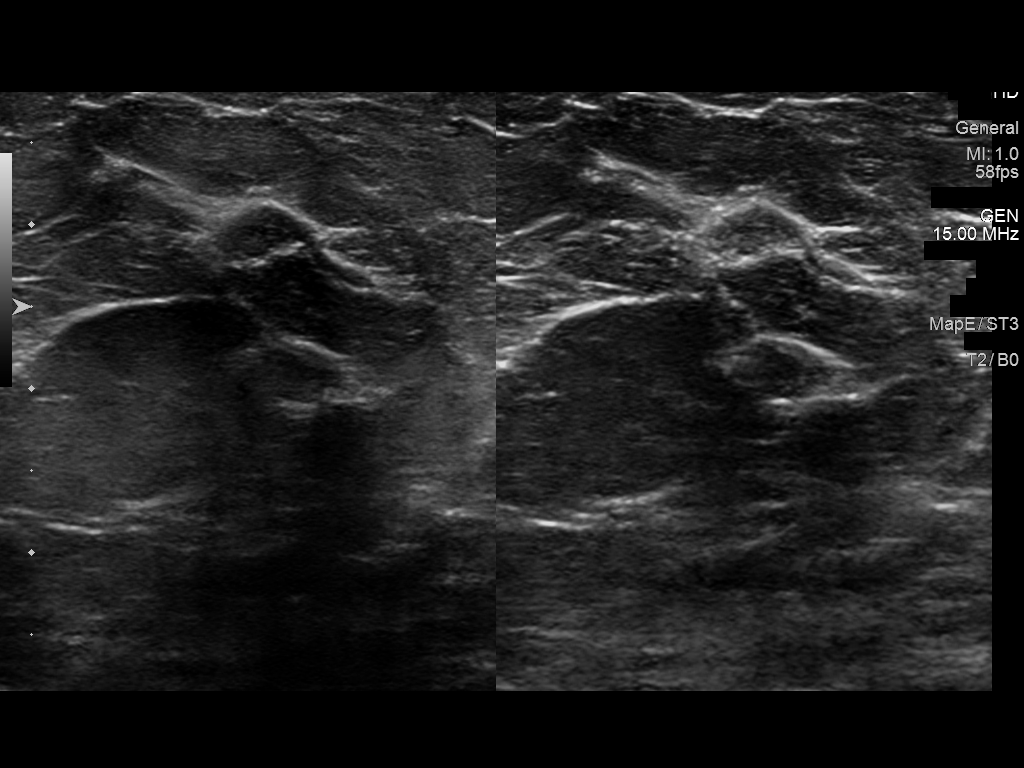
[im 5/6]
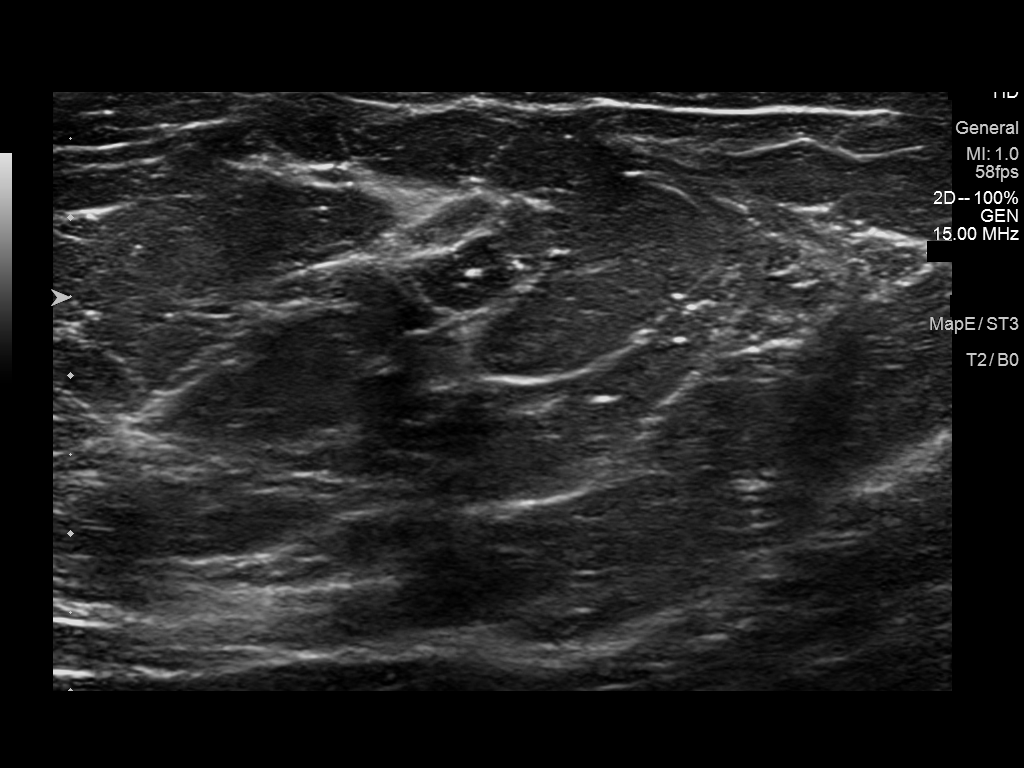
[im 6/6]
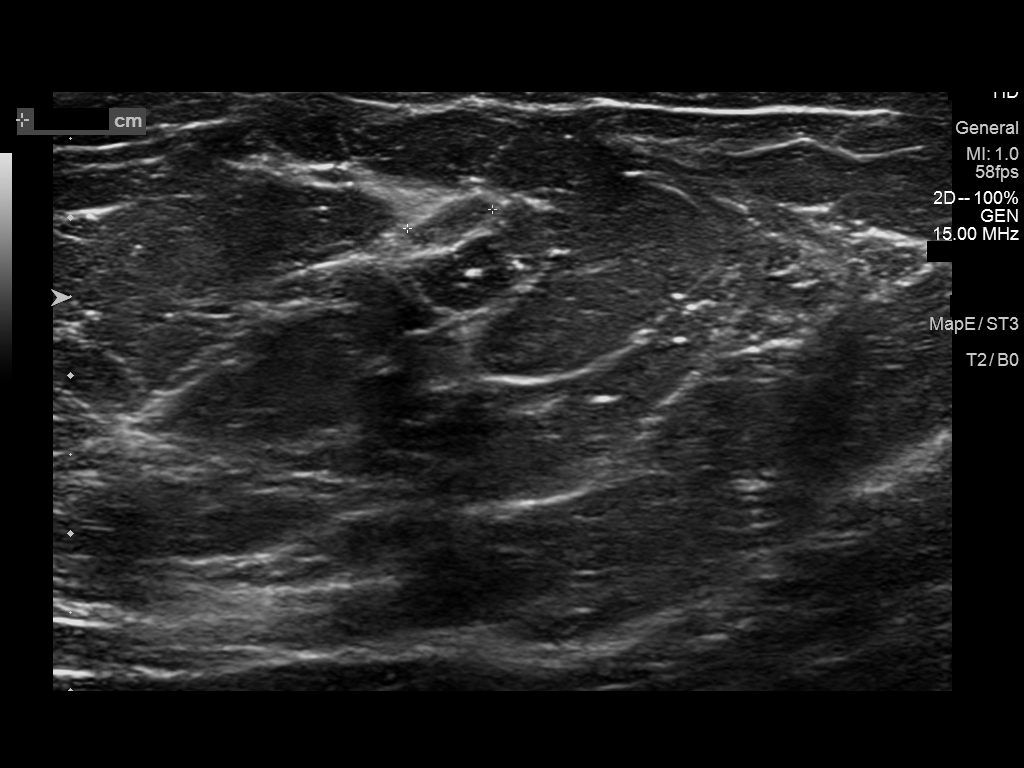

[6 of 6 positions shown; findings below may reference images not displayed]

ACR Breast Density Category b: There are scattered areas of
fibroglandular density.
FINDINGS: Mammographically, there are no new suspicious masses, areas of
architectural distortion or microcalcifications in either breast.
Coil shaped post biopsy marker is within the site of the prior
benign core needle biopsy in the left breast upper outer quadrant.

Targeted left breast ultrasound is performed demonstrating 2 o'clock
16 cm from the nipple stable circumscribed isoechoic mass measuring
0.7 x 0.4 x 0.6 cm. This corresponds to the biopsy-proven benign
mass.
IMPRESSION: No mammographic or sonographic evidence of breast malignancy.

RECOMMENDATION:
Screening mammogram in one year.(Code:[id number]-[id number])

I have discussed the findings and recommendations with the patient.
If applicable, a reminder letter will be sent to the patient
regarding the next appointment.

BI-RADS CATEGORY  2: Benign.

## 2023-02-15 ENCOUNTER — Encounter: Payer: Self-pay | Admitting: Student

## 2023-02-15 ENCOUNTER — Ambulatory Visit (INDEPENDENT_AMBULATORY_CARE_PROVIDER_SITE_OTHER): Payer: No Typology Code available for payment source | Admitting: Student

## 2023-02-15 VITALS — BP 149/89 | HR 75

## 2023-02-15 DIAGNOSIS — N62 Hypertrophy of breast: Secondary | ICD-10-CM

## 2023-02-15 MED ORDER — ONDANSETRON HCL 4 MG PO TABS: 4.0000 mg | ORAL_TABLET | Freq: Three times a day (TID) | ORAL | 0 refills | Status: AC | PRN

## 2023-02-15 MED ORDER — TRAMADOL HCL 50 MG PO TABS: 50.0000 mg | ORAL_TABLET | Freq: Three times a day (TID) | ORAL | 0 refills | Status: AC | PRN

## 2023-02-15 MED ORDER — CEPHALEXIN 500 MG PO CAPS: 500.0000 mg | ORAL_CAPSULE | Freq: Four times a day (QID) | ORAL | 0 refills | Status: AC

## 2023-02-15 NOTE — Progress Notes (Signed)
Patient ID: Margaret Anthony, female    DOB: [birth year], 52 y.o.   MRN: [id number]  Chief Complaint  Patient presents with   Pre-op Exam      ICD-10-CM   1. Symptomatic mammary hypertrophy  N62        History of Present Illness: Margaret Anthony is a 52 y.o.  female  with a history of macromastia.  She presents for preoperative evaluation for upcoming procedure, Bilateral Breast Reduction with liposuction, scheduled for 03/15/23 with Dr.  Ulice Bold  The patient has not had problems with anesthesia.  Patient reports she had a mammogram this year which was negative.  She denies any personal family history of breast cancer.  She states that she had mild mitral valve prolapse during her pregnancy and was seeing cardiology at the time 4.  She also states that she has chronic tachycardia which she has had since she was 52 years old, but no longer sees cardiology.  She denies cardiac issues since her pregnancy.  Patient does reports she takes aspirin daily because she was positive for hypercoagulation in the setting of her lupus.  She states that she recently has not had any issues with her lupus for several years.  Patient denies taking any birth control or hormone replacement.  She denies any history of miscarriages.  She denies any personal or family history of blood clots or clotting diseases.  She did states she is positive for hypercoagulation in the setting of her lupus as mentioned before.  She denies any recent traumas, surgeries, infections.  She denies any history of stroke or heart attack.  She denies any history of Crohn's disease or ulcerative colitis.  She denies any history of COPD or asthma.  She does report history of renal cell carcinoma, which required her to get a partial nephrectomy earlier this year.  She states that it is since completely resolved.  She denies any recent fevers or chills or changes in her health.  Patient reports her current bra size is a DDD cup.  She states  that she would like to be a C/D cup.  It was discussed with the patient that breast size cannot be guaranteed.  Patient expressed understanding.  Summary of Previous Visit: Patient was seen for initial consult by Dr. Ulice Bold on 11/12/2021.  At this visit, patient reported she had upper back and neck pain due to her enlarged breasts.  Her STN on the right was 35 cm and her STN on the left was 36 cm.  Her BMI was 43.7 kg/m.  Her preoperative bra size is a DDD/D cup.  The amount of breast tissue that was estimated to be removed at the time of surgery was 700 to 730 g bilaterally.  Patient wished to move forward with bilateral breast reduction.  Patient was then seen again by Dr. Ulice Bold on 08/26/2022.  At this visit, patient reported that she was very diligent about losing weight over the past few months prior to this appointment.  Her BMI dropped down to 30.1 kg/m.  The amount of excess breast tissue to be removed at the time of surgery was estimated to be between 450 to 500 g to each breast.  Her STN on the right was 35 and her STN on the left was 36.  Patient stated that she was a DDD cup and would like to be around a C cup.  Since her visit before, she had been diagnosed with renal cancer and had  a partial nephrectomy.  She had been cleared from that standpoint.  Estimated excess breast tissue to be removed at time of surgery: 4 50-500 grams  Job: Works as a Engineer, agricultural, planning to take 1 week off.  PMH Significant for: Renal cell carcinoma, macromastia, lupus, migraines   Past Medical History: Allergies: Allergies  Allergen Reactions   Hydrocodone-Acetaminophen Nausea And Vomiting   Oxycodone-Acetaminophen Nausea And Vomiting   Topiramate Other (See Comments)    Rhabdomyolysis (break down of muscle fibers)    Current Medications:  Current Outpatient Medications:    acetaminophen (TYLENOL) 500 MG tablet, Take 1,000 mg by mouth every 6 (six) hours as needed for moderate pain., Disp:  , Rfl:    diltiazem (CARDIZEM) 60 MG tablet, Take 60 mg by mouth daily., Disp: , Rfl:    docusate sodium (COLACE) 100 MG capsule, Take 1 capsule (100 mg total) by mouth 2 (two) times daily., Disp: , Rfl:    Erenumab-aooe (AIMOVIG) 70 MG/ML SOAJ, INJECT 70 MG SUBCUTANEOUSLY EVERY 28 (TWENTY-EIGHT) DAYS, Disp: , Rfl:    hydroxychloroquine (PLAQUENIL) 200 MG tablet, Take 200 mg by mouth every other day., Disp: , Rfl:    minoxidil (LONITEN) 2.5 MG tablet, Take 2.5 mg by mouth daily., Disp: , Rfl:    nystatin (MYCOSTATIN/NYSTOP) powder, Apply 1 Application topically 2 (two) times daily as needed (yeast rash)., Disp: , Rfl:    omeprazole (PRILOSEC) 20 MG capsule, Take 20 mg by mouth daily., Disp: , Rfl:    propranolol (INDERAL) 60 MG tablet, Take 60 mg by mouth 2 (two) times daily., Disp: , Rfl:    SUMAtriptan (IMITREX) 100 MG tablet, Take 25-50 mg by mouth every 2 (two) hours as needed for migraine., Disp: , Rfl:    triamcinolone cream (KENALOG) 0.1 %, Apply 1 Application topically 2 (two) times daily as needed (rash)., Disp: , Rfl:    UBRELVY 100 MG TABS, Take 100 mg by mouth daily as needed (migraines)., Disp: , Rfl:    fluticasone (FLONASE) 50 MCG/ACT nasal spray, Place 1 spray into both nostrils daily as needed for allergies or rhinitis. (Patient not taking: Reported on 02/15/2023), Disp: , Rfl:    magnesium oxide (MAG-OX) 400 (240 Mg) MG tablet, Take 1 tablet by mouth daily. (Patient not taking: Reported on 02/15/2023), Disp: , Rfl:    traMADol (ULTRAM) 50 MG tablet, Take 1-2 tablets (50-100 mg total) by mouth every 6 (six) hours as needed for moderate pain or severe pain. (Patient not taking: Reported on 02/15/2023), Disp: 20 tablet, Rfl: 0  Past Medical Problems: Past Medical History:  Diagnosis Date   Anxiety    Dysrhythmia    pcs- takes Diltiazem   GERD (gastroesophageal reflux disease)    Headache    Heart murmur    hx of years ago then another echo and none now per last echo   History of  kidney stones    Hypertension    Lupus    has PTT anticoagulant antibody   PONV (postoperative nausea and vomiting)     Past Surgical History: Past Surgical History:  Procedure Laterality Date   BREAST BIOPSY Left 01/20/2021   [place] bx/ coil clip/Benign Mammary Parenchyma with Mild Stromal Fibrosis/focal PASH   CESAREAN SECTION     x 3   HERNIA REPAIR     x 2   ROBOTIC ASSITED PARTIAL NEPHRECTOMY Right 05/25/2022   Procedure: XI ROBOTIC ASSITED PARTIAL NEPHRECTOMY;  Surgeon: Sebastian Ache, MD;  Location: [facility];  Service: Urology;  Laterality: Right;   [ilonidal cyst       Social History: Social History   Socioeconomic History   Marital status: Married    Spouse name: Not on file   Number of children: Not on file   Years of education: Not on file   Highest education level: Not on file  Occupational History   Not on file  Tobacco Use   Smoking status: Never   Smokeless tobacco: Never  Vaping Use   Vaping status: Never Used  Substance and Sexual Activity   Alcohol use: Never   Drug use: Never   Sexual activity: Not on file  Other Topics Concern   Not on file  Social History Narrative   Not on file   Social Determinants of Health   Financial Resource Strain: Low Risk  (08/17/2022)   Received from [facility], [facility]   Overall Financial Resource Strain (CARDIA)    Difficulty of Paying Living Expenses: Not hard at all  Food Insecurity: No Food Insecurity (08/17/2022)   Received from [facility], [facility]   Hunger Vital Sign    Worried About Running Out of Food in the Last Year: Never true    Ran Out of Food in the Last Year: Never true  Transportation Needs: No Transportation Needs (08/17/2022)   Received from [facility], [facility]   [facility] - Transportation    In the past 12 months, has lack of transportation kept you from medical appointments or from  getting medications?: No    Lack of Transportation (Non-Medical): No  Physical Activity: Not on file  Stress: Not on file  Social Connections: Not on file  Intimate Partner Violence: Not At Risk (05/25/2022)   Humiliation, Afraid, Rape, and Kick questionnaire    Fear of Current or Ex-Partner: No    Emotionally Abused: No    Physically Abused: No    Sexually Abused: No    Family History: Family History  Problem Relation Age of Onset   Breast cancer Neg Hx     Review of Systems: Denies any recent fevers or chills  Physical Exam: Vital Signs BP (!) 149/89 (BP Location: Left Arm, Patient Position: Sitting, Cuff Size: Normal)   Pulse 75   SpO2 99%   Physical Exam  Constitutional:      General: Not in acute distress.    Appearance: Normal appearance. Not ill-appearing.  HENT:     Head: Normocephalic and atraumatic.  Neck:     Musculoskeletal: Normal range of motion.  Cardiovascular:     Rate and Rhythm: Normal rate Pulmonary:     Effort: Pulmonary effort is normal. No respiratory distress.  Musculoskeletal: Normal range of motion.  Skin:    General: Skin is warm and dry.     Findings: No erythema or rash.  Neurological:     Mental Status: Alert and oriented to person, place, and time. Mental status is at baseline.  Psychiatric:        Mood and Affect: Mood normal.        Behavior: Behavior normal.    Assessment/Plan: The patient is scheduled for bilateral breast reduction with Dr. Ulice Bold.  Risks, benefits, and alternatives of procedure discussed, questions answered and consent obtained.    Smoking Status: Non-smoker; Counseling Given?  N/A Last Mammogram: 08/08/2022; Results: BI-RADS Category 1 negative  Caprini Score: 9; Risk Factors include: Age, positive lupus anticoagulant, history of malignancy, BMI greater  than 25, and length of planned surgery. Recommendation for mechanical and possible pharmacological prophylaxis.  Will discuss the possibility of  postoperative Lovenox with Dr. Ulice Bold.  Encourage early ambulation.   Pictures obtained: New photos taken today  Pictures were obtained of the patient and placed in the chart with the patient's or guardian's permission.   Post-op Rx sent to pharmacy:  Keflex, Zofran, tramadol  Instructed patient to hold her Plaquenil 1 month before and 1 month after surgery, discussed with her to hold her sumatriptan and Ubrelvy the morning of surgery.  Discussed that she should hold her aspirin 1 week prior to surgery.  Also discussed that she should hold any multivitamins or supplements at least 1 week prior to surgery.  Patient expressed understanding.  Will send clearance to patient's PCP for surgical clearance and to hold her aspirin 1 week prior to surgery.  Patient was provided with the breast reduction and General Surgical Risk consent document and Pain Medication Agreement prior to their appointment.  They had adequate time to read through the risk consent documents and Pain Medication Agreement. We also discussed them in person together during this preop appointment. All of their questions were answered to their satisfaction.  Recommended calling if they have any further questions.  Risk consent form and Pain Medication Agreement to be scanned into patient's chart.  The risk that can be encountered with breast reduction were discussed and include the following but not limited to these:  Breast asymmetry, fluid accumulation, firmness of the breast, inability to breast feed, loss of nipple or areola, skin loss, decrease or no nipple sensation, fat necrosis of the breast tissue, bleeding, infection, healing delay.  There are risks of anesthesia, changes to skin sensation and injury to nerves or blood vessels.  The muscle can be temporarily or permanently injured.  You may have an allergic reaction to tape, suture, glue, blood products which can result in skin discoloration, swelling, pain, skin lesions, poor  healing.  Any of these can lead to the need for revisonal surgery or stage procedures.  A reduction has potential to interfere with diagnostic procedures.  Nipple or breast piercing can increase risks of infection.  This procedure is best done when the breast is fully developed.  Changes in the breast will continue to occur over time.  Pregnancy can alter the outcomes of previous breast reduction surgery, weight gain and weigh loss can also effect the long term appearance.   Discussed with patient that given she is on Plaquenil, she may have delayed wound healing and may have wound healing complications postoperatively.  Patient expressed understanding.    Electronically signed by: Laurena Spies, PA-C 02/15/2023 11:42 AM

## 2023-02-15 NOTE — H&P (View-Only) (Signed)
 Patient ID: Margaret Anthony, female    DOB: [birth year], 52 y.o.   MRN: [id number]  Chief Complaint  Patient presents with   Pre-op Exam      ICD-10-CM   1. Symptomatic mammary hypertrophy  N62        History of Present Illness: Margaret Anthony is a 52 y.o.  female  with a history of macromastia.  She presents for preoperative evaluation for upcoming procedure, Bilateral Breast Reduction with liposuction, scheduled for 03/15/23 with Dr.  Ulice Bold  The patient has not had problems with anesthesia.  Patient reports she had a mammogram this year which was negative.  She denies any personal family history of breast cancer.  She states that she had mild mitral valve prolapse during her pregnancy and was seeing cardiology at the time 4.  She also states that she has chronic tachycardia which she has had since she was 52 years old, but no longer sees cardiology.  She denies cardiac issues since her pregnancy.  Patient does reports she takes aspirin daily because she was positive for hypercoagulation in the setting of her lupus.  She states that she recently has not had any issues with her lupus for several years.  Patient denies taking any birth control or hormone replacement.  She denies any history of miscarriages.  She denies any personal or family history of blood clots or clotting diseases.  She did states she is positive for hypercoagulation in the setting of her lupus as mentioned before.  She denies any recent traumas, surgeries, infections.  She denies any history of stroke or heart attack.  She denies any history of Crohn's disease or ulcerative colitis.  She denies any history of COPD or asthma.  She does report history of renal cell carcinoma, which required her to get a partial nephrectomy earlier this year.  She states that it is since completely resolved.  She denies any recent fevers or chills or changes in her health.  Patient reports her current bra size is a DDD cup.  She states  that she would like to be a C/D cup.  It was discussed with the patient that breast size cannot be guaranteed.  Patient expressed understanding.  Summary of Previous Visit: Patient was seen for initial consult by Dr. Ulice Bold on 11/12/2021.  At this visit, patient reported she had upper back and neck pain due to her enlarged breasts.  Her STN on the right was 35 cm and her STN on the left was 36 cm.  Her BMI was 43.7 kg/m.  Her preoperative bra size is a DDD/D cup.  The amount of breast tissue that was estimated to be removed at the time of surgery was 700 to 730 g bilaterally.  Patient wished to move forward with bilateral breast reduction.  Patient was then seen again by Dr. Ulice Bold on 08/26/2022.  At this visit, patient reported that she was very diligent about losing weight over the past few months prior to this appointment.  Her BMI dropped down to 30.1 kg/m.  The amount of excess breast tissue to be removed at the time of surgery was estimated to be between 450 to 500 g to each breast.  Her STN on the right was 35 and her STN on the left was 36.  Patient stated that she was a DDD cup and would like to be around a C cup.  Since her visit before, she had been diagnosed with renal cancer and had  a partial nephrectomy.  She had been cleared from that standpoint.  Estimated excess breast tissue to be removed at time of surgery: 4 50-500 grams  Job: Works as a Engineer, agricultural, planning to take 1 week off.  PMH Significant for: Renal cell carcinoma, macromastia, lupus, migraines   Past Medical History: Allergies: Allergies  Allergen Reactions   Hydrocodone-Acetaminophen Nausea And Vomiting   Oxycodone-Acetaminophen Nausea And Vomiting   Topiramate Other (See Comments)    Rhabdomyolysis (break down of muscle fibers)    Current Medications:  Current Outpatient Medications:    acetaminophen (TYLENOL) 500 MG tablet, Take 1,000 mg by mouth every 6 (six) hours as needed for moderate pain., Disp:  , Rfl:    diltiazem (CARDIZEM) 60 MG tablet, Take 60 mg by mouth daily., Disp: , Rfl:    docusate sodium (COLACE) 100 MG capsule, Take 1 capsule (100 mg total) by mouth 2 (two) times daily., Disp: , Rfl:    Erenumab-aooe (AIMOVIG) 70 MG/ML SOAJ, INJECT 70 MG SUBCUTANEOUSLY EVERY 28 (TWENTY-EIGHT) DAYS, Disp: , Rfl:    hydroxychloroquine (PLAQUENIL) 200 MG tablet, Take 200 mg by mouth every other day., Disp: , Rfl:    minoxidil (LONITEN) 2.5 MG tablet, Take 2.5 mg by mouth daily., Disp: , Rfl:    nystatin (MYCOSTATIN/NYSTOP) powder, Apply 1 Application topically 2 (two) times daily as needed (yeast rash)., Disp: , Rfl:    omeprazole (PRILOSEC) 20 MG capsule, Take 20 mg by mouth daily., Disp: , Rfl:    propranolol (INDERAL) 60 MG tablet, Take 60 mg by mouth 2 (two) times daily., Disp: , Rfl:    SUMAtriptan (IMITREX) 100 MG tablet, Take 25-50 mg by mouth every 2 (two) hours as needed for migraine., Disp: , Rfl:    triamcinolone cream (KENALOG) 0.1 %, Apply 1 Application topically 2 (two) times daily as needed (rash)., Disp: , Rfl:    UBRELVY 100 MG TABS, Take 100 mg by mouth daily as needed (migraines)., Disp: , Rfl:    fluticasone (FLONASE) 50 MCG/ACT nasal spray, Place 1 spray into both nostrils daily as needed for allergies or rhinitis. (Patient not taking: Reported on 02/15/2023), Disp: , Rfl:    magnesium oxide (MAG-OX) 400 (240 Mg) MG tablet, Take 1 tablet by mouth daily. (Patient not taking: Reported on 02/15/2023), Disp: , Rfl:    traMADol (ULTRAM) 50 MG tablet, Take 1-2 tablets (50-100 mg total) by mouth every 6 (six) hours as needed for moderate pain or severe pain. (Patient not taking: Reported on 02/15/2023), Disp: 20 tablet, Rfl: 0  Past Medical Problems: Past Medical History:  Diagnosis Date   Anxiety    Dysrhythmia    pcs- takes Diltiazem   GERD (gastroesophageal reflux disease)    Headache    Heart murmur    hx of years ago then another echo and none now per last echo   History of  kidney stones    Hypertension    Lupus    has PTT anticoagulant antibody   PONV (postoperative nausea and vomiting)     Past Surgical History: Past Surgical History:  Procedure Laterality Date   BREAST BIOPSY Left 01/20/2021   [place] bx/ coil clip/Benign Mammary Parenchyma with Mild Stromal Fibrosis/focal PASH   CESAREAN SECTION     x 3   HERNIA REPAIR     x 2   ROBOTIC ASSITED PARTIAL NEPHRECTOMY Right 05/25/2022   Procedure: XI ROBOTIC ASSITED PARTIAL NEPHRECTOMY;  Surgeon: Sebastian Ache, MD;  Location: [facility];  Service: Urology;  Laterality: Right;   [ilonidal cyst       Social History: Social History   Socioeconomic History   Marital status: Married    Spouse name: Not on file   Number of children: Not on file   Years of education: Not on file   Highest education level: Not on file  Occupational History   Not on file  Tobacco Use   Smoking status: Never   Smokeless tobacco: Never  Vaping Use   Vaping status: Never Used  Substance and Sexual Activity   Alcohol use: Never   Drug use: Never   Sexual activity: Not on file  Other Topics Concern   Not on file  Social History Narrative   Not on file   Social Determinants of Health   Financial Resource Strain: Low Risk  (08/17/2022)   Received from [facility], [facility]   Overall Financial Resource Strain (CARDIA)    Difficulty of Paying Living Expenses: Not hard at all  Food Insecurity: No Food Insecurity (08/17/2022)   Received from [facility], [facility]   Hunger Vital Sign    Worried About Running Out of Food in the Last Year: Never true    Ran Out of Food in the Last Year: Never true  Transportation Needs: No Transportation Needs (08/17/2022)   Received from [facility], [facility]   [facility] - Transportation    In the past 12 months, has lack of transportation kept you from medical appointments or from  getting medications?: No    Lack of Transportation (Non-Medical): No  Physical Activity: Not on file  Stress: Not on file  Social Connections: Not on file  Intimate Partner Violence: Not At Risk (05/25/2022)   Humiliation, Afraid, Rape, and Kick questionnaire    Fear of Current or Ex-Partner: No    Emotionally Abused: No    Physically Abused: No    Sexually Abused: No    Family History: Family History  Problem Relation Age of Onset   Breast cancer Neg Hx     Review of Systems: Denies any recent fevers or chills  Physical Exam: Vital Signs BP (!) 149/89 (BP Location: Left Arm, Patient Position: Sitting, Cuff Size: Normal)   Pulse 75   SpO2 99%   Physical Exam  Constitutional:      General: Not in acute distress.    Appearance: Normal appearance. Not ill-appearing.  HENT:     Head: Normocephalic and atraumatic.  Neck:     Musculoskeletal: Normal range of motion.  Cardiovascular:     Rate and Rhythm: Normal rate Pulmonary:     Effort: Pulmonary effort is normal. No respiratory distress.  Musculoskeletal: Normal range of motion.  Skin:    General: Skin is warm and dry.     Findings: No erythema or rash.  Neurological:     Mental Status: Alert and oriented to person, place, and time. Mental status is at baseline.  Psychiatric:        Mood and Affect: Mood normal.        Behavior: Behavior normal.    Assessment/Plan: The patient is scheduled for bilateral breast reduction with Dr. Ulice Bold.  Risks, benefits, and alternatives of procedure discussed, questions answered and consent obtained.    Smoking Status: Non-smoker; Counseling Given?  N/A Last Mammogram: 08/08/2022; Results: BI-RADS Category 1 negative  Caprini Score: 9; Risk Factors include: Age, positive lupus anticoagulant, history of malignancy, BMI greater  than 25, and length of planned surgery. Recommendation for mechanical and possible pharmacological prophylaxis.  Will discuss the possibility of  postoperative Lovenox with Dr. Ulice Bold.  Encourage early ambulation.   Pictures obtained: New photos taken today  Pictures were obtained of the patient and placed in the chart with the patient's or guardian's permission.   Post-op Rx sent to pharmacy:  Keflex, Zofran, tramadol  Instructed patient to hold her Plaquenil 1 month before and 1 month after surgery, discussed with her to hold her sumatriptan and Ubrelvy the morning of surgery.  Discussed that she should hold her aspirin 1 week prior to surgery.  Also discussed that she should hold any multivitamins or supplements at least 1 week prior to surgery.  Patient expressed understanding.  Will send clearance to patient's PCP for surgical clearance and to hold her aspirin 1 week prior to surgery.  Patient was provided with the breast reduction and General Surgical Risk consent document and Pain Medication Agreement prior to their appointment.  They had adequate time to read through the risk consent documents and Pain Medication Agreement. We also discussed them in person together during this preop appointment. All of their questions were answered to their satisfaction.  Recommended calling if they have any further questions.  Risk consent form and Pain Medication Agreement to be scanned into patient's chart.  The risk that can be encountered with breast reduction were discussed and include the following but not limited to these:  Breast asymmetry, fluid accumulation, firmness of the breast, inability to breast feed, loss of nipple or areola, skin loss, decrease or no nipple sensation, fat necrosis of the breast tissue, bleeding, infection, healing delay.  There are risks of anesthesia, changes to skin sensation and injury to nerves or blood vessels.  The muscle can be temporarily or permanently injured.  You may have an allergic reaction to tape, suture, glue, blood products which can result in skin discoloration, swelling, pain, skin lesions, poor  healing.  Any of these can lead to the need for revisonal surgery or stage procedures.  A reduction has potential to interfere with diagnostic procedures.  Nipple or breast piercing can increase risks of infection.  This procedure is best done when the breast is fully developed.  Changes in the breast will continue to occur over time.  Pregnancy can alter the outcomes of previous breast reduction surgery, weight gain and weigh loss can also effect the long term appearance.   Discussed with patient that given she is on Plaquenil, she may have delayed wound healing and may have wound healing complications postoperatively.  Patient expressed understanding.    Electronically signed by: Laurena Spies, PA-C 02/15/2023 11:42 AM

## 2023-03-03 ENCOUNTER — Encounter (HOSPITAL_BASED_OUTPATIENT_CLINIC_OR_DEPARTMENT_OTHER): Payer: Self-pay | Admitting: Plastic Surgery

## 2023-03-14 NOTE — Anesthesia Preprocedure Evaluation (Signed)
Anesthesia Evaluation  Patient identified by MRN, date of birth, ID band Patient awake    Reviewed: Allergy & Precautions, H&P , NPO status , Patient's Chart, lab work & pertinent test results, reviewed documented beta blocker date and time   History of Anesthesia Complications (+) PONV and history of anesthetic complications  Airway Mallampati: III  TM Distance: >3 FB Neck ROM: Full    Dental no notable dental hx. (+) Teeth Intact, Dental Advisory Given   Pulmonary neg pulmonary ROS   Pulmonary exam normal breath sounds clear to auscultation       Cardiovascular Exercise Tolerance: Good hypertension, Pt. on medications and Pt. on home beta blockers negative cardio ROS + dysrhythmias + Valvular Problems/Murmurs  Rhythm:Regular Rate:Normal     Neuro/Psych  Headaches  Anxiety     negative neurological ROS  negative psych ROS   GI/Hepatic negative GI ROS, Neg liver ROS,GERD  Medicated,,  Endo/Other  negative endocrine ROS    Renal/GU negative Renal ROS  negative genitourinary   Musculoskeletal   Abdominal   Peds  Hematology negative hematology ROS (+)   Anesthesia Other Findings   Reproductive/Obstetrics negative OB ROS                              Anesthesia Physical Anesthesia Plan  ASA: 2  Anesthesia Plan: General   Post-op Pain Management: Celebrex PO (pre-op)* and Tylenol PO (pre-op)*   Induction: Intravenous  PONV Risk Score and Plan: 4 or greater and Ondansetron, Dexamethasone, Treatment may vary due to age or medical condition, Scopolamine patch - Pre-op and Midazolam  Airway Management Planned: Oral ETT and LMA  Additional Equipment: None  Intra-op Plan:   Post-operative Plan: Extubation in OR  Informed Consent: I have reviewed the patients History and Physical, chart, labs and discussed the procedure including the risks, benefits and alternatives for the proposed  anesthesia with the patient or authorized representative who has indicated his/her understanding and acceptance.     Dental advisory given  Plan Discussed with: Anesthesiologist  Anesthesia Plan Comments: (  )        Anesthesia Quick Evaluation

## 2023-03-15 ENCOUNTER — Encounter (HOSPITAL_BASED_OUTPATIENT_CLINIC_OR_DEPARTMENT_OTHER): Payer: Self-pay | Admitting: Plastic Surgery

## 2023-03-15 ENCOUNTER — Other Ambulatory Visit: Payer: Self-pay

## 2023-03-15 ENCOUNTER — Ambulatory Visit (HOSPITAL_BASED_OUTPATIENT_CLINIC_OR_DEPARTMENT_OTHER)
Admission: RE | Admit: 2023-03-15 | Discharge: 2023-03-15 | Disposition: A | Discharge status: Home / Self Care | Payer: No Typology Code available for payment source | Attending: Plastic Surgery | Admitting: Plastic Surgery

## 2023-03-15 ENCOUNTER — Encounter (HOSPITAL_BASED_OUTPATIENT_CLINIC_OR_DEPARTMENT_OTHER)
Admission: RE | Disposition: A | Discharge status: Home / Self Care | Payer: Self-pay | Source: Home / Self Care | Attending: Plastic Surgery

## 2023-03-15 ENCOUNTER — Ambulatory Visit (HOSPITAL_BASED_OUTPATIENT_CLINIC_OR_DEPARTMENT_OTHER): Payer: Self-pay | Admitting: Anesthesiology

## 2023-03-15 ENCOUNTER — Ambulatory Visit (HOSPITAL_BASED_OUTPATIENT_CLINIC_OR_DEPARTMENT_OTHER): Payer: No Typology Code available for payment source | Admitting: Anesthesiology

## 2023-03-15 DIAGNOSIS — K219 Gastro-esophageal reflux disease without esophagitis: Secondary | ICD-10-CM | POA: Insufficient documentation

## 2023-03-15 DIAGNOSIS — N62 Hypertrophy of breast: Secondary | ICD-10-CM

## 2023-03-15 DIAGNOSIS — N6021 Fibroadenosis of right breast: Secondary | ICD-10-CM | POA: Diagnosis not present

## 2023-03-15 DIAGNOSIS — Z79899 Other long term (current) drug therapy: Secondary | ICD-10-CM | POA: Insufficient documentation

## 2023-03-15 DIAGNOSIS — R519 Headache, unspecified: Secondary | ICD-10-CM | POA: Insufficient documentation

## 2023-03-15 DIAGNOSIS — F419 Anxiety disorder, unspecified: Secondary | ICD-10-CM | POA: Insufficient documentation

## 2023-03-15 DIAGNOSIS — I10 Essential (primary) hypertension: Secondary | ICD-10-CM | POA: Insufficient documentation

## 2023-03-15 DIAGNOSIS — Z87442 Personal history of urinary calculi: Secondary | ICD-10-CM | POA: Diagnosis not present

## 2023-03-15 DIAGNOSIS — Z419 Encounter for procedure for purposes other than remedying health state, unspecified: Secondary | ICD-10-CM

## 2023-03-15 DIAGNOSIS — M549 Dorsalgia, unspecified: Secondary | ICD-10-CM | POA: Insufficient documentation

## 2023-03-15 DIAGNOSIS — M542 Cervicalgia: Secondary | ICD-10-CM | POA: Insufficient documentation

## 2023-03-15 DIAGNOSIS — Z7982 Long term (current) use of aspirin: Secondary | ICD-10-CM | POA: Insufficient documentation

## 2023-03-15 HISTORY — PX: BREAST REDUCTION SURGERY: SHX8

## 2023-03-15 LAB — POCT PREGNANCY, URINE: Preg Test, Ur: NEGATIVE

## 2023-03-15 SURGERY — BREAST REDUCTION WITH LIPOSUCTION
Anesthesia: General | Site: Breast | Laterality: Bilateral

## 2023-03-15 MED ORDER — FENTANYL CITRATE (PF) 100 MCG/2ML IJ SOLN
25.0000 ug | INTRAMUSCULAR | Status: DC | PRN
  Administered 2023-03-15: 50 ug via INTRAVENOUS

## 2023-03-15 MED ORDER — LIDOCAINE-EPINEPHRINE 1 %-1:100000 IJ SOLN
INTRAMUSCULAR | Status: DC | PRN
  Administered 2023-03-15: 50 mL via INTRAMUSCULAR

## 2023-03-15 MED ORDER — AMISULPRIDE (ANTIEMETIC) 5 MG/2ML IV SOLN
10.0000 mg | Freq: Once | INTRAVENOUS | Status: AC
  Administered 2023-03-15: 10 mg via INTRAVENOUS

## 2023-03-15 MED ORDER — ONDANSETRON HCL 4 MG/2ML IJ SOLN
INTRAMUSCULAR | Status: AC
Start: 2023-03-15 — End: ?
  Filled 2023-03-15: qty 2

## 2023-03-15 MED ORDER — VASHE WOUND IRRIGATION OPTIME
TOPICAL | Status: DC | PRN
  Administered 2023-03-15: 34 [oz_av]

## 2023-03-15 MED ORDER — LACTATED RINGERS IV SOLN: INTRAVENOUS | Status: DC

## 2023-03-15 MED ORDER — LIDOCAINE HCL 1 % IJ SOLN
INTRAVENOUS | Status: DC | PRN
  Administered 2023-03-15: 1000 mL

## 2023-03-15 MED ORDER — MIDAZOLAM HCL 5 MG/5ML IJ SOLN
INTRAMUSCULAR | Status: DC | PRN
  Administered 2023-03-15: 2 mg via INTRAVENOUS

## 2023-03-15 MED ORDER — LIDOCAINE HCL (CARDIAC) PF 100 MG/5ML IV SOSY
PREFILLED_SYRINGE | INTRAVENOUS | Status: DC | PRN
  Administered 2023-03-15: 20 mg via INTRAVENOUS

## 2023-03-15 MED ORDER — DEXAMETHASONE SODIUM PHOSPHATE 4 MG/ML IJ SOLN
INTRAMUSCULAR | Status: DC | PRN
  Administered 2023-03-15: 5 mg via INTRAVENOUS

## 2023-03-15 MED ORDER — FENTANYL CITRATE (PF) 100 MCG/2ML IJ SOLN
INTRAMUSCULAR | Status: AC
  Filled 2023-03-15: qty 2

## 2023-03-15 MED ORDER — EPINEPHRINE PF 1 MG/ML IJ SOLN
INTRAMUSCULAR | Status: AC
  Filled 2023-03-15: qty 1

## 2023-03-15 MED ORDER — ATROPINE SULFATE 0.4 MG/ML IV SOLN
INTRAVENOUS | Status: AC
  Filled 2023-03-15: qty 1

## 2023-03-15 MED ORDER — NITROGLYCERIN 2 % TD OINT
TOPICAL_OINTMENT | TRANSDERMAL | Status: DC | PRN
  Administered 2023-03-15: .5 [in_us] via TOPICAL

## 2023-03-15 MED ORDER — DEXAMETHASONE SODIUM PHOSPHATE 10 MG/ML IJ SOLN
INTRAMUSCULAR | Status: AC
  Filled 2023-03-15: qty 1

## 2023-03-15 MED ORDER — CEFAZOLIN SODIUM-DEXTROSE 2-4 GM/100ML-% IV SOLN
INTRAVENOUS | Status: AC
  Filled 2023-03-15: qty 100

## 2023-03-15 MED ORDER — ONDANSETRON HCL 4 MG/2ML IJ SOLN
INTRAMUSCULAR | Status: AC
  Filled 2023-03-15: qty 2

## 2023-03-15 MED ORDER — SODIUM CHLORIDE (PF) 0.9 % IJ SOLN
INTRAMUSCULAR | Status: AC
  Filled 2023-03-15: qty 10

## 2023-03-15 MED ORDER — SUCCINYLCHOLINE CHLORIDE 200 MG/10ML IV SOSY
PREFILLED_SYRINGE | INTRAVENOUS | Status: AC
  Filled 2023-03-15: qty 10

## 2023-03-15 MED ORDER — CELECOXIB 200 MG PO CAPS
200.0000 mg | ORAL_CAPSULE | Freq: Once | ORAL | Status: AC
  Administered 2023-03-15: 200 mg via ORAL

## 2023-03-15 MED ORDER — CHLORHEXIDINE GLUCONATE CLOTH 2 % EX PADS: 6.0000 | MEDICATED_PAD | Freq: Once | CUTANEOUS | Status: DC

## 2023-03-15 MED ORDER — LIDOCAINE HCL (PF) 1 % IJ SOLN
INTRAMUSCULAR | Status: AC
  Filled 2023-03-15: qty 30

## 2023-03-15 MED ORDER — ACETAMINOPHEN 325 MG PO TABS: 325.0000 mg | ORAL_TABLET | ORAL | Status: DC | PRN

## 2023-03-15 MED ORDER — SODIUM CHLORIDE 0.9 % IV SOLN
INTRAVENOUS | Status: DC | PRN
  Administered 2023-03-15: 40 mL

## 2023-03-15 MED ORDER — NITROGLYCERIN 2 % TD OINT
TOPICAL_OINTMENT | TRANSDERMAL | Status: AC
  Filled 2023-03-15: qty 30

## 2023-03-15 MED ORDER — CELECOXIB 200 MG PO CAPS
ORAL_CAPSULE | ORAL | Status: AC
  Filled 2023-03-15: qty 1

## 2023-03-15 MED ORDER — SCOPOLAMINE 1 MG/3DAYS TD PT72SCOPOLAMINE 1 MG/3DAYS
1.0000 | MEDICATED_PATCH | TRANSDERMAL | Status: DC
Start: 2023-03-15 — End: 2023-03-15
  Administered 2023-03-15: 1.5 mg via TRANSDERMAL

## 2023-03-15 MED ORDER — AMISULPRIDE (ANTIEMETIC) 5 MG/2ML IV SOLN
INTRAVENOUS | Status: AC
  Filled 2023-03-15: qty 4

## 2023-03-15 MED ORDER — OXYCODONE HCL 5 MG PO TABS: 5.0000 mg | ORAL_TABLET | Freq: Once | ORAL | Status: DC | PRN

## 2023-03-15 MED ORDER — LIDOCAINE 2% (20 MG/ML) 5 ML SYRINGE
INTRAMUSCULAR | Status: AC
  Filled 2023-03-15: qty 5

## 2023-03-15 MED ORDER — SCOPOLAMINE 1 MG/3DAYS TD PT72
MEDICATED_PATCH | TRANSDERMAL | Status: AC
  Filled 2023-03-15: qty 1

## 2023-03-15 MED ORDER — LIDOCAINE-EPINEPHRINE 1 %-1:100000 IJ SOLN
INTRAMUSCULAR | Status: AC
  Filled 2023-03-15: qty 1

## 2023-03-15 MED ORDER — OXYCODONE HCL 5 MG/5ML PO SOLN: 5.0000 mg | Freq: Once | ORAL | Status: DC | PRN

## 2023-03-15 MED ORDER — ACETAMINOPHEN 500 MG PO TABS
1000.0000 mg | ORAL_TABLET | Freq: Once | ORAL | Status: AC
  Administered 2023-03-15: 1000 mg via ORAL

## 2023-03-15 MED ORDER — BUPIVACAINE HCL (PF) 0.25 % IJ SOLN
INTRAMUSCULAR | Status: AC
  Filled 2023-03-15: qty 30

## 2023-03-15 MED ORDER — EPHEDRINE 5 MG/ML INJ
INTRAVENOUS | Status: AC
  Filled 2023-03-15: qty 5

## 2023-03-15 MED ORDER — PHENYLEPHRINE 80 MCG/ML (10ML) SYRINGE FOR IV PUSH (FOR BLOOD PRESSURE SUPPORT)
PREFILLED_SYRINGE | INTRAVENOUS | Status: AC
  Filled 2023-03-15: qty 10

## 2023-03-15 MED ORDER — ACETAMINOPHEN 160 MG/5ML PO SOLN: 325.0000 mg | ORAL | Status: DC | PRN

## 2023-03-15 MED ORDER — MEPERIDINE HCL 25 MG/ML IJ SOLN: 6.2500 mg | INTRAMUSCULAR | Status: DC | PRN

## 2023-03-15 MED ORDER — FENTANYL CITRATE (PF) 100 MCG/2ML IJ SOLN
INTRAMUSCULAR | Status: DC | PRN
  Administered 2023-03-15: 100 ug via INTRAVENOUS
  Administered 2023-03-15 (×2): 50 ug via INTRAVENOUS

## 2023-03-15 MED ORDER — ACETAMINOPHEN 500 MG PO TABS
ORAL_TABLET | ORAL | Status: AC
  Filled 2023-03-15: qty 2

## 2023-03-15 MED ORDER — PROPOFOL 10 MG/ML IV BOLUS
INTRAVENOUS | Status: DC | PRN
  Administered 2023-03-15: 150 mg via INTRAVENOUS

## 2023-03-15 MED ORDER — ONDANSETRON HCL 4 MG/2ML IJ SOLN
4.0000 mg | Freq: Once | INTRAMUSCULAR | Status: AC | PRN
  Administered 2023-03-15: 4 mg via INTRAVENOUS

## 2023-03-15 MED ORDER — MIDAZOLAM HCL 2 MG/2ML IJ SOLN
INTRAMUSCULAR | Status: AC
Start: 2023-03-15 — End: ?
  Filled 2023-03-15: qty 2

## 2023-03-15 MED ORDER — CEFAZOLIN SODIUM-DEXTROSE 2-4 GM/100ML-% IV SOLN
2.0000 g | INTRAVENOUS | Status: AC
  Administered 2023-03-15 (×2): 2 g via INTRAVENOUS

## 2023-03-15 MED ORDER — EPHEDRINE SULFATE (PRESSORS) 50 MG/ML IJ SOLN
INTRAMUSCULAR | Status: DC | PRN
  Administered 2023-03-15 (×2): 10 mg via INTRAVENOUS

## 2023-03-15 SURGICAL SUPPLY — 62 items
BINDER BREAST LRG (GAUZE/BANDAGES/DRESSINGS) IMPLANT
BINDER BREAST MEDIUM (GAUZE/BANDAGES/DRESSINGS) IMPLANT
BINDER BREAST XLRG (GAUZE/BANDAGES/DRESSINGS) IMPLANT
BINDER BREAST XXLRG (GAUZE/BANDAGES/DRESSINGS) IMPLANT
BIOPATCH RED 1 DISK 7.0 (GAUZE/BANDAGES/DRESSINGS) IMPLANT
BLADE HEX COATED 2.75 (ELECTRODE) IMPLANT
BLADE KNIFE PERSONA 10 (BLADE) ×2 IMPLANT
BLADE SURG 15 STRL LF DISP TIS (BLADE) ×1 IMPLANT
CANISTER SUCT 1200ML W/VALVE (MISCELLANEOUS) ×1 IMPLANT
CLEANSER WND VASHE 34 (WOUND CARE) ×1 IMPLANT
COLLAGEN CELLERATERX 1 GRAM (Miscellaneous) IMPLANT
COVER BACK TABLE 60X90IN (DRAPES) ×1 IMPLANT
COVER MAYO STAND STRL (DRAPES) ×1 IMPLANT
DERMABOND ADVANCED .7 DNX12 (GAUZE/BANDAGES/DRESSINGS) ×2 IMPLANT
DRAIN CHANNEL 19F RND (DRAIN) IMPLANT
DRAPE LAPAROSCOPIC ABDOMINAL (DRAPES) ×1 IMPLANT
DRAPE UTILITY XL STRL (DRAPES) ×1 IMPLANT
DRSG MEPILEX POST OP 4X8 (GAUZE/BANDAGES/DRESSINGS) ×2 IMPLANT
DRSG TEGADERM 4X4.75 (GAUZE/BANDAGES/DRESSINGS) IMPLANT
ELECT BLADE 4.0 EZ CLEAN MEGAD (MISCELLANEOUS) ×1
ELECT REM PT RETURN 9FT ADLT (ELECTROSURGICAL) ×1
ELECTRODE BLDE 4.0 EZ CLN MEGD (MISCELLANEOUS) ×1 IMPLANT
ELECTRODE REM PT RTRN 9FT ADLT (ELECTROSURGICAL) ×1 IMPLANT
EVACUATOR SILICONE 100CC (DRAIN) IMPLANT
GAUZE PAD ABD 8X10 STRL (GAUZE/BANDAGES/DRESSINGS) ×2 IMPLANT
GLOVE BIO SURGEON STRL SZ 6.5 (GLOVE) ×2 IMPLANT
GLOVE BIO SURGEON STRL SZ7.5 (GLOVE) ×1 IMPLANT
GLOVE BIOGEL PI IND STRL 7.0 (GLOVE) IMPLANT
GLOVE BIOGEL PI IND STRL 8 (GLOVE) IMPLANT
GOWN STRL REUS W/ TWL LRG LVL3 (GOWN DISPOSABLE) ×2 IMPLANT
GOWN STRL REUS W/ TWL XL LVL3 (GOWN DISPOSABLE) ×1 IMPLANT
NDL FILTER BLUNT 18X1 1/2 (NEEDLE) IMPLANT
NDL HYPO 25X1 1.5 SAFETY (NEEDLE) ×2 IMPLANT
NEEDLE FILTER BLUNT 18X1 1/2 (NEEDLE) ×1
NEEDLE HYPO 25X1 1.5 SAFETY (NEEDLE) ×2
NS IRRIG 1000ML POUR BTL (IV SOLUTION) IMPLANT
PACK BASIN DAY SURGERY FS (CUSTOM PROCEDURE TRAY) ×1 IMPLANT
PAD ALCOHOL SWAB (MISCELLANEOUS) IMPLANT
PAD FOAM SILICONE BACKED (GAUZE/BANDAGES/DRESSINGS) IMPLANT
PENCIL SMOKE EVACUATOR (MISCELLANEOUS) ×1 IMPLANT
PIN SAFETY STERILE (MISCELLANEOUS) IMPLANT
SLEEVE SCD COMPRESS KNEE MED (STOCKING) ×1 IMPLANT
SPIKE FLUID TRANSFER (MISCELLANEOUS) IMPLANT
SPONGE T-LAP 18X18 ~~LOC~~+RFID (SPONGE) ×2 IMPLANT
STRIP SUTURE WOUND CLOSURE 1/2 (MISCELLANEOUS) ×4 IMPLANT
SUT MNCRL AB 4-0 PS2 18 (SUTURE) ×4 IMPLANT
SUT MON AB 3-0 SH27 (SUTURE) ×4 IMPLANT
SUT MON AB 5-0 PS2 18 (SUTURE) IMPLANT
SUT PDS 3-0 CT2 (SUTURE) ×5
SUT PDS II 3-0 CT2 27 ABS (SUTURE) ×4 IMPLANT
SUT SILK 3 0 PS 1 (SUTURE) IMPLANT
SYR 50ML LL SCALE MARK (SYRINGE) IMPLANT
SYR BULB IRRIG 60ML STRL (SYRINGE) ×1 IMPLANT
SYR CONTROL 10ML LL (SYRINGE) ×2 IMPLANT
TAPE MEASURE VINYL STERILE (MISCELLANEOUS) IMPLANT
TOWEL GREEN STERILE FF (TOWEL DISPOSABLE) ×3 IMPLANT
TRAY DSU PREP LF (CUSTOM PROCEDURE TRAY) ×1 IMPLANT
TUBE CONNECTING 20X1/4 (TUBING) ×1 IMPLANT
TUBING INFILTRATION IT-10001 (TUBING) IMPLANT
TUBING SET GRADUATE ASPIR 12FT (MISCELLANEOUS) IMPLANT
UNDERPAD 30X36 HEAVY ABSORB (UNDERPADS AND DIAPERS) ×2 IMPLANT
YANKAUER SUCT BULB TIP NO VENT (SUCTIONS) ×1 IMPLANT

## 2023-03-15 NOTE — Anesthesia Procedure Notes (Signed)
Procedure Name: LMA Insertion Date/Time: 03/15/2023 10:00 AM  Performed by: Ronnette Hila, CRNAPre-anesthesia Checklist: Patient identified, Emergency Drugs available, Suction available and Patient being monitored Patient Re-evaluated:Patient Re-evaluated prior to induction Oxygen Delivery Method: Circle System Utilized Preoxygenation: Pre-oxygenation with 100% oxygen Induction Type: IV induction Ventilation: Mask ventilation without difficulty LMA: LMA inserted LMA Size: 4.0 Number of attempts: 1 Airway Equipment and Method: bite block Placement Confirmation: positive ETCO2 Tube secured with: Tape Dental Injury: Teeth and Oropharynx as per pre-operative assessment

## 2023-03-15 NOTE — Op Note (Signed)
Breast Reduction Op note:    DATE OF PROCEDURE: 03/15/2023  LOCATION: [facility]  SURGEON: Foster Simpson, DO  ASSISTANT: Evelena Leyden, PA  PREOPERATIVE DIAGNOSIS 1. Macromastia 2. Neck Pain 3. Back Pain  POSTOPERATIVE DIAGNOSIS 1. Macromastia 2. Neck Pain 3. Back Pain  PROCEDURES 1. Bilateral breast reduction.  Right reduction 665 g, Left reduction 720 g  COMPLICATIONS: None.  DRAINS: none  INDICATIONS FOR PROCEDURE Margaret Anthony is a 52 y.o. year-old female born on 1970-07-21,with a history of symptomatic macromastia with concominant back pain, neck pain, shoulder grooving from her bra.   MRN: [id number]  CONSENT Informed consent was obtained directly from the patient. The risks, benefits and alternatives were fully discussed. Specific risks including but not limited to bleeding, infection, hematoma, seroma, scarring, pain, nipple necrosis, asymmetry, poor cosmetic results, and need for further surgery were discussed. The patient's questions were answered.  DESCRIPTION OF PROCEDURE  Patient was brought into the operating room and rested on the operating room table in the supine position.  SCDs were placed and appropriate padding was performed.  Antibiotics were given. The patient underwent general anesthesia and the chest was prepped and draped in a sterile fashion.  A timeout was performed and all information was confirmed to be correct by those in the room. Tumescent was placed in each breast at the lateral portion.  Liposuction was done to improve contour and bulk.  Right side: Preoperative markings were confirmed.  Incision lines were injected with local containing epinephrine.  After waiting for vasoconstriction, the marked lines were incised with a #15 blade.  A Wise-pattern superomedial breast reduction was performed by de-epithelializing the pedicle, using bovie to create the superomedial pedicle, and removing breast tissue from the  superior, lateral, and inferior portions of the breast.  Care was taken to not undermine the breast pedicle. Hemostasis was achieved.  The nipple was gently rotated into position and the soft tissue closed with 4-0 Monocryl.   The pocket was irrigated and hemostasis confirmed.  The deep tissues were approximated with 3-0 PDS sutures.  The skin was closed with deep dermal 3-0 Monocryl and subcuticular 4-0 Monocryl sutures.  The nipple and skin flaps had good capillary refill at the end of the procedure.    Left side: Preoperative markings were confirmed.  Incision lines were injected with local containing epinephrine.  After waiting for vasoconstriction, the marked lines were incised with a #15 blade.  A Wise-pattern superomedial breast reduction was performed by de-epithelializing the pedicle, using bovie to create the superomedial pedicle, and removing breast tissue from the superior, lateral, and inferior portions of the breast.  Care was taken to not undermine the breast pedicle. Hemostasis was achieved.  The nipple was gently rotated into position and the soft tissue was closed with 4-0 Monocryl.  The patient was sat upright and size and shape symmetry was confirmed.  The pocket was irrigated and hemostasis confirmed.  Experel and Cellerate were placed in each pocket. The deep tissues were approximated with 3-0 PDS sutures. The skin was closed with deep dermal 3-0 Monocryl and subcuticular 4-0 Monocryl sutures.  Dermabond was applied.  A breast binder and ABDs were placed.  The nipple and skin flaps had good capillary refill at the end of the procedure.  The patient tolerated the procedure well. The patient was allowed to wake from anesthesia and taken to the recovery room in satisfactory condition.  The advanced practice practitioner (APP) assisted throughout the case.  The APP  was essential in retraction and counter traction when needed to make the case progress smoothly.  This retraction and assistance  made it possible to see the tissue plans for the procedure.  The assistance was needed for blood control, tissue re-approximation and assisted with closure of the incision site.

## 2023-03-15 NOTE — Discharge Instructions (Addendum)
Post Anesthesia Home Care Instructions  Activity: Get plenty of rest for the remainder of the day. A responsible individual must stay with you for 24 hours following the procedure.  For the next 24 hours, DO NOT: -Drive a car -Advertising copywriter -Drink alcoholic beverages -Take any medication unless instructed by your physician -Make any legal decisions or sign important papers.  Meals: Start with liquid foods such as gelatin or soup. Progress to regular foods as tolerated. Avoid greasy, spicy, heavy foods. If nausea and/or vomiting occur, drink only clear liquids until the nausea and/or vomiting subsides. Call your physician if vomiting continues.  Special Instructions/Symptoms: Your throat may feel dry or sore from the anesthesia or the breathing tube placed in your throat during surgery. If this causes discomfort, gargle with warm salt water. The discomfort should disappear within 24 hours.  If you had a scopolamine patch placed behind your ear for the management of post- operative nausea and/or vomiting:  1. The medication in the patch is effective for 72 hours, after which it should be removed.  Wrap patch in a tissue and discard in the trash. Wash hands thoroughly with soap and water. 2. You may remove the patch earlier than 72 hours if you experience unpleasant side effects which may include dry mouth, dizziness or visual disturbances. 3. Avoid touching the patch. Wash your hands with soap and water after contact with the patch.       Information for Discharge Teaching: EXPAREL (bupivacaine liposome injectable suspension)   Pain relief is important to your recovery. The goal is to control your pain so you can move easier and return to your normal activities as soon as possible after your procedure. Your physician may use several types of medicines to manage pain, swelling, and more.  Your surgeon or anesthesiologist gave you EXPAREL(bupivacaine) to help control your pain after  surgery.  EXPAREL is a local anesthetic designed to release slowly over an extended period of time to provide pain relief by numbing the tissue around the surgical site. EXPAREL is designed to release pain medication over time and can control pain for up to 72 hours. Depending on how you respond to EXPAREL, you may require less pain medication during your recovery. EXPAREL can help reduce or eliminate the need for opioids during the first few days after surgery when pain relief is needed the most. EXPAREL is not an opioid and is not addictive. It does not cause sleepiness or sedation.   Important! A teal colored band has been placed on your arm with the date, time and amount of EXPAREL you have received. Please leave this armband in place for the full 96 hours following administration, and then you may remove the band. If you return to the hospital for any reason within 96 hours following the administration of EXPAREL, the armband provides important information that your health care providers to know, and alerts them that you have received this anesthetic.    Possible side effects of EXPAREL: Temporary loss of sensation or ability to move in the area where medication was injected. Nausea, vomiting, constipation Rarely, numbness and tingling in your mouth or lips, lightheadedness, or anxiety may occur. Call your doctor right away if you think you may be experiencing any of these sensations, or if you have other questions regarding possible side effects.  Follow all other discharge instructions given to you by your surgeon or nurse. Eat a healthy diet and drink plenty of water or other fluids.  Next dose  of tylenol may be taken at 5p  INSTRUCTIONS FOR AFTER BREAST SURGERY   You will likely have some questions about what to expect following your operation.  The following information will help you and your family understand what to expect when you are discharged from the hospital.  It is important  to follow these guidelines to help ensure a smooth recovery and reduce complication.  Postoperative instructions include information on: diet, wound care, medications and physical activity.  AFTER SURGERY Expect to go home after the procedure.  In some cases, you may need to spend one night in the hospital for observation.  DIET Breast surgery does not require a specific diet.  However, the healthier you eat the better your body will heal. It is important to increasing your protein intake.  This means limiting the foods with sugar and carbohydrates.  Focus on vegetables and some meat.  If you have liposuction during your procedure be sure to drink water.  If your urine is bright yellow, then it is concentrated, and you need to drink more water.  As a general rule after surgery, you should have 8 ounces of water every hour while awake.  If you find you are persistently nauseated or unable to take in liquids let us know.  NO TOBACCO USE or EXPOSURE.  This will slow your healing process and lead to a wound.  WOUND CARE Leave the binder on for 3 days . Use fragrance free soap like Dial, Dove or [place].   After 3 days you can remove the binder to shower. Once dry apply binder or sports bra. If you have liposuction you will have a soft and spongy dressing (Lipofoam) that helps prevent creases in your skin.  Remove before you shower and then replace it.  It is also available on [employer]. If you have steri-strips / tape directly attached to your skin leave them in place. It is OK to get these wet.   No baths, pools or hot tubs for four weeks. We close your incision to leave the smallest and best-looking scar. No ointment or creams on your incisions for four weeks.  No Neosporin (Too many skin reactions).  A few weeks after surgery you can use Mederma and start massaging the scar. We ask you to wear your binder or sports bra for the first 6 weeks around the clock, including while sleeping. This provides added  comfort and helps reduce the fluid accumulation at the surgery site. NO Ice or heating pads to the operative site.  You have a very high risk of a BURN before you feel the temperature change. Apply 1/2" of the NitroPaste ointment to the LEFT nipple/areola TWICE daily x 3 days.   ACTIVITY No heavy lifting until cleared by the doctor.  This usually means no more than a half-gallon of milk.  It is OK to walk and climb stairs. Moving your legs is very important to decrease your risk of a blood clot.  It will also help keep you from getting deconditioned.  Every 1 to 2 hours get up and walk for 5 minutes. This will help with a quicker recovery back to normal.  Let pain be your guide so you don't do too much.  This time is for you to recover.  You will be more comfortable if you sleep and rest with your head elevated either with a few pillows under you or in a recliner.  No stomach sleeping for a three months.  WORK Everyone returns  to work at different times. As a rough guide, most people take at least 1 - 2 weeks off prior to returning to work. If you need documentation for your job, give the forms to the front staff at the clinic.  DRIVING Arrange for someone to bring you home from the hospital after your surgery.  You may be able to drive a few days after surgery but not while taking any narcotics or valium.  BOWEL MOVEMENTS Constipation can occur after anesthesia and while taking pain medication.  It is important to stay ahead for your comfort.  We recommend taking Milk of Magnesia (2 tablespoons; twice a day) while taking the pain pills.  MEDICATIONS You may be prescribed should start after surgery At your preoperative visit for you history and physical you may have been given the following medications: An antibiotic: Start this medication when you get home and take according to the instructions on the bottle. Zofran 4 mg:  This is to treat nausea and vomiting.  You can take this every 6 hours as  needed and only if needed. Valium 2 mg for breast cancer patients: This is for muscle tightness if you have an implant or expander. This will help relax your muscle which also helps with pain control.  This can be taken every 12 hours as needed. Don't drive after taking this medication. Norco (hydrocodone/acetaminophen) 5/325 mg:  This is only to be used after you have taken the Motrin or the Tylenol. Every 8 hours as needed.   Over the counter Medication to take: Ibuprofen (Motrin) 600 mg:  Take this every 6 hours.  If you have additional pain then take 500 mg of the Tylenol every 8 hours.  Only take the Norco after you have tried these two. MiraLAX or Milk of Magnesia: Take this according to the bottle if you take the Norco.  WHEN TO CALL Call your surgeon's office if any of the following occur: Fever 101 degrees F or greater Excessive bleeding or fluid from the incision site. Pain that increases over time without aid from the medications Redness, warmth, or pus draining from incision sites Persistent nausea or inability to take in liquids Severe misshapen area that underwent the operation.

## 2023-03-15 NOTE — Interval H&P Note (Signed)
History and Physical Interval Note:  03/15/2023 8:27 AM  Margaret Anthony  has presented today for surgery, with the diagnosis of macromastia.  The various methods of treatment have been discussed with the patient and family. After consideration of risks, benefits and other options for treatment, the patient has consented to  Procedure(s): BREAST REDUCTION WITH LIPOSUCTION (Bilateral) as a surgical intervention.  The patient's history has been reviewed, patient examined, no change in status, stable for surgery.  I have reviewed the patient's chart and labs.  Questions were answered to the patient's satisfaction.     Alena Bills Sage Kopera

## 2023-03-15 NOTE — Transfer of Care (Signed)
Immediate Anesthesia Transfer of Care Note  Patient: Margaret Anthony  Procedure(s) Performed: BREAST REDUCTION WITH LIPOSUCTION (Bilateral: Breast)  Patient Location: PACU  Anesthesia Type:General  Level of Consciousness: awake, alert , oriented, drowsy, and patient cooperative  Airway & Oxygen Therapy: Patient Spontanous Breathing and Patient connected to face mask oxygen  Post-op Assessment: Report given to RN and Post -op Vital signs reviewed and stable  Post vital signs: Reviewed and stable  Last Vitals:  Vitals Value Taken Time  BP    Temp    Pulse 55 03/15/23 1211  Resp 16 03/15/23 1211  SpO2 96 % 03/15/23 1211  Vitals shown include unfiled device data.  Last Pain:  Vitals:   03/15/23 0737  TempSrc: Oral  PainSc: 1       Patients Stated Pain Goal: 8 (03/15/23 0737)  Complications: No notable events documented.

## 2023-03-16 ENCOUNTER — Encounter (HOSPITAL_BASED_OUTPATIENT_CLINIC_OR_DEPARTMENT_OTHER): Payer: Self-pay | Admitting: Plastic Surgery

## 2023-03-16 LAB — SURGICAL PATHOLOGY

## 2023-03-16 NOTE — Anesthesia Postprocedure Evaluation (Signed)
Anesthesia Post Note  Patient: Margaret Anthony  Procedure(s) Performed: BREAST REDUCTION WITH LIPOSUCTION (Bilateral: Breast)     Patient location during evaluation: PACU Anesthesia Type: General Level of consciousness: awake and alert Pain management: pain level controlled Vital Signs Assessment: post-procedure vital signs reviewed and stable Respiratory status: spontaneous breathing, nonlabored ventilation, respiratory function stable and patient connected to nasal cannula oxygen Cardiovascular status: blood pressure returned to baseline and stable Postop Assessment: no apparent nausea or vomiting Anesthetic complications: no   No notable events documented.  Last Vitals:  Vitals:   03/15/23 1315 03/15/23 1333  BP: (!) 146/81 (!) 155/79  Pulse: (!) 51 65  Resp: 10 16  Temp:  (!) 36.4 C  SpO2: 95% 97%    Last Pain:  Vitals:   03/16/23 0906  TempSrc:   PainSc: 4                  Shelton Silvas

## 2023-03-17 ENCOUNTER — Ambulatory Visit: Payer: No Typology Code available for payment source | Admitting: Physician Assistant

## 2023-03-17 DIAGNOSIS — Z9889 Other specified postprocedural states: Secondary | ICD-10-CM

## 2023-03-17 NOTE — Progress Notes (Signed)
Patient is a pleasant 52 year old female s/p bilateral breast reduction with liposuction performed 03/15/2023 Dr. Ulice Bold who joins via telephone for postoperative day 2 check-in.  Reviewed operative report and 665 g removed from the right breast, 720 g removed from the left breast.  Today, patient states that she is doing well.  She battled nausea postoperatively and had to take Zofran for much of yesterday.  However, she has not required it this morning and is tolerating p.o. intake without difficulty.  She is voiding, including BM.  She is ambulatory at home.  Husband is assisting with her postoperative recovery.  Her primary concern has been sleeping on her back which we discussed is important for the first couple of weeks postoperatively.  She does have a sleep number bed and recliner which will make it somewhat tolerable.  Her pain has been well-controlled.  It is predominantly in the axillary regions bilaterally where she had liposuction performed.  Pain is symmetric.  Bruising, as anticipated.  Discussed showering.  She denies any chest pain, leg swelling, difficulty breathing, or fevers.  Recommend continued activity modifications and compressive garments.  Follow-up next week, as scheduled.  She will call the office should she have any questions or concerns in the interim.

## 2023-03-24 ENCOUNTER — Encounter: Payer: Self-pay | Admitting: Plastic Surgery

## 2023-03-24 ENCOUNTER — Ambulatory Visit (INDEPENDENT_AMBULATORY_CARE_PROVIDER_SITE_OTHER): Payer: No Typology Code available for payment source | Admitting: Plastic Surgery

## 2023-03-24 VITALS — BP 158/84 | HR 74 | Ht 64.0 in | Wt 177.0 lb

## 2023-03-24 DIAGNOSIS — N62 Hypertrophy of breast: Secondary | ICD-10-CM

## 2023-03-24 DIAGNOSIS — M542 Cervicalgia: Secondary | ICD-10-CM

## 2023-03-24 DIAGNOSIS — M546 Pain in thoracic spine: Secondary | ICD-10-CM

## 2023-03-24 DIAGNOSIS — G8929 Other chronic pain: Secondary | ICD-10-CM

## 2023-03-24 NOTE — Progress Notes (Signed)
   Subjective:    Patient ID: Margaret Anthony, female    DOB: [birth year], 52 y.o.   MRN: [id number]  The patient is a 52 year old female here for evaluation and follow-up from her breast reduction from December 4.  She had over 600 g removed from both breasts.  She has some redness and irritation on the right lateral inferior breast.  This could be from wet dressings.  It is quite irritated but it does not look infected.  I took the dressings off.      Review of Systems  Constitutional: Negative.   HENT: Negative.    Eyes: Negative.   Respiratory: Negative.    Cardiovascular: Negative.   Gastrointestinal: Negative.   Endocrine: Negative.   Genitourinary: Negative.        Objective:   Physical Exam Vitals reviewed.  Constitutional:      Appearance: Normal appearance.  Cardiovascular:     Rate and Rhythm: Normal rate.     Pulses: Normal pulses.  Pulmonary:     Effort: Pulmonary effort is normal.  Skin:    Capillary Refill: Capillary refill takes less than 2 seconds.  Neurological:     Mental Status: She is alert and oriented to person, place, and time.  Psychiatric:        Mood and Affect: Mood normal.        Behavior: Behavior normal.        Thought Content: Thought content normal.        Judgment: Judgment normal.        Assessment & Plan:     ICD-10-CM   1. Symptomatic mammary hypertrophy  N62     2. Chronic bilateral thoracic back pain  M54.6    G89.29     3. Neck pain  M54.2       I would like the patient to use Vashe on the incision site of the inframammary fold on the right.  She is to call if it gets any worse and I definitely want to see her back in a week.

## 2023-04-03 ENCOUNTER — Encounter: Payer: No Typology Code available for payment source | Admitting: Physician Assistant

## 2023-04-06 NOTE — Progress Notes (Cosign Needed)
Patient is a pleasant 52 year old female s/p bilateral breast reduction with liposuction performed 03/15/2023 Dr. Ulice Bold who presents to clinic for postoperative follow-up.  She was seen most recently by Dr. Ulice Bold 03/24/2023.  At that time, she had some redness and irritation in the right lateral inferior breast.  Possibly dermatitis related to wet dressings, nonconcerning for infection.  Recommended Vashe and close outpatient follow-up.  Today, patient is doing well from a postoperative standpoint.  She reports considerable improvement in her chronic upper back and neck discomfort related to her macromastia since the breast reduction.  She denies any fevers, chest pain, difficulty breathing, leg swelling.  The redness that she experienced earlier on in her postoperative recovery has significantly improved.  She has been using the Vashe regularly, as directed.  On exam, Steri-Strips are all removed without complication or difficulty.  Incisions CDI throughout with the exception of a 2 x 1 cm right inferior T zone wound.  Superficial, nondraining.  NAC's are healthy and viable.  Breasts are soft throughout.  Recommend continued activity modifications and compressive garments and follow-up in 2 weeks for likely final postoperative encounter.  Daily Vaseline to the incisions throughout in the interim.  Picture(s) obtained of the patient and placed in the chart were with the patient's or guardian's permission.

## 2023-04-07 ENCOUNTER — Ambulatory Visit (INDEPENDENT_AMBULATORY_CARE_PROVIDER_SITE_OTHER): Payer: No Typology Code available for payment source | Admitting: Physician Assistant

## 2023-04-07 VITALS — BP 130/87 | HR 77

## 2023-04-07 DIAGNOSIS — Z9889 Other specified postprocedural states: Secondary | ICD-10-CM

## 2023-04-20 NOTE — Progress Notes (Signed)
 Patient is a pleasant 53 year old female s/p bilateral breast reduction with liposuction performed 03/15/2023 Dr. Lowery who presents to clinic for postoperative follow-up.   She was last seen here in clinic on 04/07/2023.  At that time, the redness she had experienced earlier on in her postoperative recovery had improved significantly.  Incisions CDI throughout with the exception of 2 x 1 cm right inferior T zone wound that was superficial and nondraining.  Recommended Vaseline to the incisions throughout and follow-up in 2 weeks for likely final postoperative encounter.  Today, patient is doing well from a postoperative standpoint.  She states that the wound has improved dramatically since last encounter.  She has continued with regular application of Vashe wound cleanser as well as Vaseline to the incisions.  She says the Vaseline helped with residual Dermabond.  She will occasionally experience some drainage from the right inferior T zone wound, but minimally and reports that she is otherwise doing well.  She is quite pleased with the outcome of her breast reduction surgery and does report some improvement in her chronic upper back and neck discomfort.  On exam, breasts have excellent shape and symmetry.  Soft throughout.  No palpable underlying areas of firmness.  NAC's are healthy and viable.  There is a small, less than 0.25 cm inferior T zone wound right breast.  There is an additional pinpoint wound just lateral to the left inferior T zone wound likely from suture knot.  Small area of surrounding irritation, but no induration or findings otherwise concerning for infection.  Recommending continued Vashe wound cleanser and Vaseline regularly.  Can likely transition to silicone scar gels twice daily x 3 months in the next 1 to 2 weeks.  Increase activity as tolerated.  Continue to abstain from submerging the breast in water  until she is fully epithelialized and without any residual wounds.  She is  overall quite pleased with the outcome of her reduction surgery.  Follow-up only as needed.  Pictures obtained last visit.

## 2023-04-21 ENCOUNTER — Ambulatory Visit (INDEPENDENT_AMBULATORY_CARE_PROVIDER_SITE_OTHER): Payer: No Typology Code available for payment source | Admitting: Physician Assistant

## 2023-04-21 VITALS — BP 131/75 | HR 73

## 2023-04-21 DIAGNOSIS — Z9889 Other specified postprocedural states: Secondary | ICD-10-CM

## 2023-08-24 ENCOUNTER — Other Ambulatory Visit: Payer: Self-pay | Admitting: Internal Medicine

## 2023-08-24 DIAGNOSIS — Z1231 Encounter for screening mammogram for malignant neoplasm of breast: Secondary | ICD-10-CM

## 2023-09-20 ENCOUNTER — Encounter: Admitting: Certified Nurse Midwife

## 2023-10-04 ENCOUNTER — Ambulatory Visit (INDEPENDENT_AMBULATORY_CARE_PROVIDER_SITE_OTHER): Admitting: Certified Nurse Midwife

## 2023-10-04 ENCOUNTER — Encounter: Payer: Self-pay | Admitting: Certified Nurse Midwife

## 2023-10-04 VITALS — BP 137/91 | HR 70 | Ht 64.0 in | Wt 187.3 lb

## 2023-10-04 DIAGNOSIS — Z1231 Encounter for screening mammogram for malignant neoplasm of breast: Secondary | ICD-10-CM

## 2023-10-04 DIAGNOSIS — Z01419 Encounter for gynecological examination (general) (routine) without abnormal findings: Secondary | ICD-10-CM | POA: Diagnosis not present

## 2023-10-04 NOTE — Progress Notes (Signed)
 ANNUAL EXAM Patient name: Margaret Anthony MRN [id number]  Date of birth: 03/21/1971 Chief Complaint:   Gynecologic Exam  History of Present Illness:   Margaret Anthony is a 53 y.o. G66P3003 Caucasian female being seen today for a routine annual exam.  Current complaints: none, pap up to date. Cologard completed 2024.   Patient's last menstrual period was 09/10/2023 (approximate).   Upstream - 10/04/23 1138       Pregnancy Intention Screening   Does the patient want to become pregnant in the next year? No    Does the patient's partner want to become pregnant in the next year? No    Would the patient like to discuss contraceptive options today? No      Contraception Wrap Up   Current Method Female Sterilization    End Method Female Sterilization    Contraception Counseling Provided No    How was the end contraceptive method provided? N/A         The pregnancy intention screening data noted above was reviewed. Potential methods of contraception were discussed. The patient elected to proceed with Female Sterilization.       Last pap 4/24. Results were: NILM w/ HRHPV negative. H/O abnormal pap: no Last mammogram: 4/24. Results were: normal. Family h/o breast cancer: no Last Cologuard: 5/24. Results were: normal. Family h/o colorectal cancer: no     10/04/2023   11:39 AM  Depression screen PHQ 2/9  Decreased Interest 0  Down, Depressed, Hopeless 0  PHQ - 2 Score 0        10/04/2023   11:39 AM  GAD 7 : Generalized Anxiety Score  Nervous, Anxious, on Edge 0  Control/stop worrying 0     Review of Systems:   Pertinent items are noted in HPI Denies any headaches, blurred vision, fatigue, shortness of breath, chest pain, abdominal pain, abnormal vaginal discharge/itching/odor/irritation, problems with periods, bowel movements, urination, or intercourse unless otherwise stated above. Pertinent History Reviewed:  Reviewed past medical,surgical, social and family history.   Reviewed problem list, medications and allergies. Physical Assessment:   Vitals:   10/04/23 1028  BP: (!) 137/91  Pulse: 70  Weight: 187 lb 4.8 oz (85 kg)  Height: 5' 4 (1.626 m)  Body mass index is 32.15 kg/m.       Physical Exam Vitals reviewed.  Constitutional:      General: She is not in acute distress.    Appearance: Normal appearance.  HENT:     Head: Normocephalic.  Neck:     Thyroid: No thyroid mass or thyromegaly.   Cardiovascular:     Rate and Rhythm: Normal rate and regular rhythm.     Heart sounds: Normal heart sounds.  Pulmonary:     Effort: Pulmonary effort is normal.     Breath sounds: Normal breath sounds.  Chest:  Breasts:    Tanner Score is 5.     Right: Normal.     Left: Normal.     Comments: Well healed incision sites from breast reduction present bilaterally. Abdominal:     General: Abdomen is flat.     Palpations: Abdomen is soft.     Tenderness: There is no abdominal tenderness.  Genitourinary:    Comments: deferred  Musculoskeletal:     Cervical back: Neck supple. No tenderness.  Lymphadenopathy:     Upper Body:     Right upper body: No axillary adenopathy.     Left upper body: No axillary adenopathy.   Skin:  General: Skin is warm and dry.   Neurological:     General: No focal deficit present.     Mental Status: She is alert and oriented to person, place, and time.   Psychiatric:        Mood and Affect: Mood normal.        Behavior: Behavior normal.      No results found for this or any previous visit (from the past 24 hours).  Assessment & Plan:  1. Encounter for well woman exam with routine gynecological exam (Primary)  2. Encounter for screening mammogram for malignant neoplasm of breast   Mammogram: schedule screening mammo as soon as possible, recommend 12m after reduction Colonoscopy: per GI, or sooner if problems  No orders of the defined types were placed in this encounter.   Meds: No orders of the defined  types were placed in this encounter.   Follow-up: Return in 1 year (on 10/03/2024) for Annual exam.  Harlene LITTIE Cisco, CNM 10/04/2023 11:43 AM

## 2023-11-01 ENCOUNTER — Other Ambulatory Visit: Payer: Self-pay | Admitting: Medical Genetics

## 2023-11-08 ENCOUNTER — Ambulatory Visit
Admission: RE | Admit: 2023-11-08 | Discharge: 2023-11-08 | Disposition: A | Discharge status: Home / Self Care | Source: Ambulatory Visit | Attending: Internal Medicine | Admitting: Internal Medicine

## 2023-11-08 ENCOUNTER — Encounter: Payer: Self-pay | Admitting: Radiology

## 2023-11-08 DIAGNOSIS — Z1231 Encounter for screening mammogram for malignant neoplasm of breast: Secondary | ICD-10-CM | POA: Diagnosis present

## 2024-02-05 ENCOUNTER — Other Ambulatory Visit: Payer: Self-pay | Admitting: Medical Genetics

## 2024-02-05 DIAGNOSIS — Z006 Encounter for examination for normal comparison and control in clinical research program: Secondary | ICD-10-CM

## 2024-04-06 LAB — GENECONNECT MOLECULAR SCREEN: Genetic Analysis Overall Interpretation: NEGATIVE
